# Patient Record
Sex: Female | Born: 1935 | Race: White | Hispanic: No | Marital: Married | Smoking: Never smoker
Health system: Southern US, Community
[De-identification: ages and names within clinical notes are randomized; demographics above are authoritative.]

## PROBLEM LIST (undated history)

## (undated) DIAGNOSIS — G039 Meningitis, unspecified: Secondary | ICD-10-CM

## (undated) DIAGNOSIS — E538 Deficiency of other specified B group vitamins: Secondary | ICD-10-CM

## (undated) DIAGNOSIS — E039 Hypothyroidism, unspecified: Secondary | ICD-10-CM

## (undated) DIAGNOSIS — H409 Unspecified glaucoma: Secondary | ICD-10-CM

## (undated) DIAGNOSIS — H269 Unspecified cataract: Secondary | ICD-10-CM

## (undated) DIAGNOSIS — E0500 Thyrotoxicosis with diffuse goiter without thyrotoxic crisis or storm: Secondary | ICD-10-CM

## (undated) DIAGNOSIS — E559 Vitamin D deficiency, unspecified: Secondary | ICD-10-CM

## (undated) DIAGNOSIS — B029 Zoster without complications: Secondary | ICD-10-CM

## (undated) HISTORY — PX: SPINAL CORD DECOMPRESSION: SHX97

## (undated) HISTORY — PX: THYROID SURGERY: SHX805

## (undated) HISTORY — DX: Hypothyroidism, unspecified: E03.9

## (undated) HISTORY — DX: Thyrotoxicosis with diffuse goiter without thyrotoxic crisis or storm: E05.00

## (undated) HISTORY — DX: Unspecified cataract: H26.9

## (undated) HISTORY — DX: Vitamin D deficiency, unspecified: E55.9

## (undated) HISTORY — PX: CATARACT EXTRACTION, BILATERAL: SHX1313

## (undated) HISTORY — DX: Meningitis, unspecified: G03.9

## (undated) HISTORY — DX: Unspecified glaucoma: H40.9

## (undated) HISTORY — DX: Deficiency of other specified B group vitamins: E53.8

## (undated) HISTORY — DX: Zoster without complications: B02.9

---

## 2015-07-07 DIAGNOSIS — Z8639 Personal history of other endocrine, nutritional and metabolic disease: Secondary | ICD-10-CM | POA: Insufficient documentation

## 2015-07-07 DIAGNOSIS — H409 Unspecified glaucoma: Secondary | ICD-10-CM | POA: Insufficient documentation

## 2016-03-03 DIAGNOSIS — Z9889 Other specified postprocedural states: Secondary | ICD-10-CM | POA: Insufficient documentation

## 2016-03-04 DIAGNOSIS — E559 Vitamin D deficiency, unspecified: Secondary | ICD-10-CM | POA: Insufficient documentation

## 2016-03-04 DIAGNOSIS — E538 Deficiency of other specified B group vitamins: Secondary | ICD-10-CM | POA: Insufficient documentation

## 2018-04-12 LAB — LIPID PANEL
Cholesterol: 216 — AB (ref 0–200)
HDL: 57 (ref 35–70)
LDL Cholesterol: 128
Triglycerides: 153 (ref 40–160)

## 2018-04-12 LAB — TSH: TSH: 1.55 (ref 0.41–5.90)

## 2018-04-12 LAB — VITAMIN B12: Vitamin B-12: 231

## 2018-04-12 LAB — VITAMIN D 25 HYDROXY (VIT D DEFICIENCY, FRACTURES): Vit D, 25-Hydroxy: 24.9

## 2019-01-21 LAB — CBC AND DIFFERENTIAL
HCT: 37 (ref 36–46)
Hemoglobin: 13.5 (ref 12.0–16.0)
Neutrophils Absolute: 10
Platelets: 333 (ref 150–399)
WBC: 13

## 2019-01-21 LAB — COMPREHENSIVE METABOLIC PANEL
Albumin: 3.8 (ref 3.5–5.0)
Calcium: 9.7 (ref 8.7–10.7)
GFR calc Af Amer: 43
GFR calc non Af Amer: 36
Globulin: 3.5

## 2019-01-21 LAB — BASIC METABOLIC PANEL
BUN: 26 — AB (ref 4–21)
CO2: 25 — AB (ref 13–22)
Chloride: 95 — AB (ref 99–108)
Creatinine: 1.4 — AB (ref 0.5–1.1)
Glucose: 112
Potassium: 3.8 (ref 3.4–5.3)
Sodium: 134 — AB (ref 137–147)

## 2019-01-21 LAB — HEPATIC FUNCTION PANEL
ALT: 15 (ref 7–35)
AST: 20 (ref 13–35)
Alkaline Phosphatase: 57 (ref 25–125)

## 2019-01-21 LAB — CBC: RBC: 4.54 (ref 3.87–5.11)

## 2019-10-07 ENCOUNTER — Encounter: Payer: Self-pay | Admitting: Physician Assistant

## 2019-10-07 ENCOUNTER — Other Ambulatory Visit: Payer: Self-pay | Admitting: Physician Assistant

## 2019-10-07 MED ORDER — LEVOTHYROXINE SODIUM 88 MCG PO TABS: 88.0000 ug | ORAL_TABLET | Freq: Every day | ORAL | 3 refills | Status: DC

## 2019-10-07 MED ORDER — BETIMOL 0.5 % OP SOLN: 1.0000 [drp] | Freq: Two times a day (BID) | OPHTHALMIC | 12 refills | Status: AC

## 2019-10-29 ENCOUNTER — Other Ambulatory Visit: Payer: Self-pay

## 2019-10-30 ENCOUNTER — Ambulatory Visit (INDEPENDENT_AMBULATORY_CARE_PROVIDER_SITE_OTHER): Payer: Medicare PPO | Admitting: Physician Assistant

## 2019-10-30 ENCOUNTER — Encounter: Payer: Self-pay | Admitting: Physician Assistant

## 2019-10-30 ENCOUNTER — Other Ambulatory Visit: Payer: Self-pay | Admitting: Physician Assistant

## 2019-10-30 ENCOUNTER — Ambulatory Visit (INDEPENDENT_AMBULATORY_CARE_PROVIDER_SITE_OTHER): Payer: Self-pay

## 2019-10-30 VITALS — BP 100/62 | HR 63 | Temp 97.3°F | Ht 67.0 in | Wt 133.2 lb

## 2019-10-30 DIAGNOSIS — M545 Low back pain, unspecified: Secondary | ICD-10-CM

## 2019-10-30 DIAGNOSIS — E039 Hypothyroidism, unspecified: Secondary | ICD-10-CM | POA: Diagnosis not present

## 2019-10-30 DIAGNOSIS — E559 Vitamin D deficiency, unspecified: Secondary | ICD-10-CM | POA: Diagnosis not present

## 2019-10-30 DIAGNOSIS — E538 Deficiency of other specified B group vitamins: Secondary | ICD-10-CM | POA: Diagnosis not present

## 2019-10-30 LAB — BASIC METABOLIC PANEL
BUN: 14 mg/dL (ref 6–23)
CO2: 26 mEq/L (ref 19–32)
Calcium: 10 mg/dL (ref 8.4–10.5)
Chloride: 105 mEq/L (ref 96–112)
Creatinine, Ser: 0.71 mg/dL (ref 0.40–1.20)
GFR: 78.08 mL/min (ref >60.00)
Glucose, Bld: 98 mg/dL (ref 70–99)
Potassium: 4.4 mEq/L (ref 3.5–5.1)
Sodium: 142 mEq/L (ref 135–145)

## 2019-10-30 LAB — VITAMIN D 25 HYDROXY (VIT D DEFICIENCY, FRACTURES): VITD: 31.28 ng/mL (ref 30.00–100.00)

## 2019-10-30 LAB — VITAMIN B12: Vitamin B-12: 133 pg/mL — ABNORMAL LOW (ref 211–911)

## 2019-10-30 LAB — TSH: TSH: 0.3 u[IU]/mL — ABNORMAL LOW (ref 0.35–4.50)

## 2019-10-30 NOTE — Progress Notes (Signed)
Teresa Hahn is a 86 y.o. female here to Establish care.  I acted as a Education administrator for [employer], PA-C Anselmo Pickler, LPN  History of Present Illness:   Chief Complaint  Patient presents with  . Back Pain   Back pain Pt c/o back pain x 1 week, lower back area and to the right. Pt was lifting her dog into bed. Heard a crack. Has been using heating pad and is taking 2 Excedrin at bedtime. L3-5 surgery in the past. Pain persists since that time. She denies new or unusual bowel/bladder incontinence, numbness/tingling, saddle anesthesia.  Hypothyroidism Has been on medication 15 years. Currently on 92mcg levothyroxine. Last TSH was checked in 2019 and was 1.55. Denies: fatigue, changes in weight, appetite changes.  Vit D and Vit B12 Deficiency History of this and would like labs checked today.  Health Maintenance: Immunizations -- Pt declines all vaccines Colonoscopy -- Never Mammogram -- Never PAP -- N/A Bone Density -- Never Weight -- Weight: 133 lb 4 oz (60.4 kg)   Depression screen [facility] 2/9 10/30/2019  Decreased Interest 0  Down, Depressed, Hopeless 0  PHQ - 2 Score 0    No flowsheet data found.   Other providers/specialists: Patient Care Team: Inda Coke, [place] as PCP - General (Physician Assistant)   Past Medical History:  Diagnosis Date  . Cataract   . Glaucoma   . Graves disease   . Hypothyroidism   . Meningitis    Viral  . Shingles    has had 4 times  . Vitamin B12 deficiency   . Vitamin D deficiency      Social History   Socioeconomic History  . Marital status: Married    Spouse name: Not on file  . Number of children: Not on file  . Years of education: Not on file  . Highest education level: Not on file  Occupational History  . Not on file  Tobacco Use  . Smoking status: Never Smoker  . Smokeless tobacco: Never Used  Substance and Sexual Activity  . Alcohol use: Never  . Drug use: Never  . Sexual activity: Not Currently  Other Topics  Concern  . Not on file  Social History Narrative   Married   Social Determinants of Health   Financial Resource Strain:   . Difficulty of Paying Living Expenses: Not on file  Food Insecurity:   . Worried About Charity fundraiser in the Last Year: Not on file  . Ran Out of Food in the Last Year: Not on file  Transportation Needs:   . Lack of Transportation (Medical): Not on file  . Lack of Transportation (Non-Medical): Not on file  Physical Activity:   . Days of Exercise per Week: Not on file  . Minutes of Exercise per Session: Not on file  Stress:   . Feeling of Stress : Not on file  Social Connections:   . Frequency of Communication with Friends and Family: Not on file  . Frequency of Social Gatherings with Friends and Family: Not on file  . Attends Religious Services: Not on file  . Active Member of Clubs or Organizations: Not on file  . Attends Archivist Meetings: Not on file  . Marital Status: Not on file  Intimate Partner Violence:   . Fear of Current or Ex-Partner: Not on file  . Emotionally Abused: Not on file  . Physically Abused: Not on file  . Sexually Abused: Not on file    Past  Surgical History:  Procedure Laterality Date  . CATARACT EXTRACTION, BILATERAL    . SPINAL CORD DECOMPRESSION     L3-5  . THYROID SURGERY      Family History  Problem Relation Age of Onset  . Alcohol abuse Mother   . Alcohol abuse Father     Allergies  Allergen Reactions  . Cyclobenzaprine Other (See Comments)    Mental lethargy     Current Medications:   Current Outpatient Medications:  .  levothyroxine (SYNTHROID) 88 MCG tablet, Take 1 tablet (88 mcg total) by mouth daily., Disp: 90 tablet, Rfl: 3 .  timolol (BETIMOL) 0.5 % ophthalmic solution, Place 1 drop into both eyes 2 (two) times daily., Disp: 10 mL, Rfl: 12   Review of Systems:   ROS  Negative unless otherwise specified per HPI.  Vitals:   Vitals:   10/30/19 0827  BP: 100/62  Pulse: 63   Temp: (!) 97.3 F (36.3 C)  TempSrc: Temporal  SpO2: 96%  Weight: 133 lb 4 oz (60.4 kg)  Height: 5\' 7"  (1.702 m)      Body mass index is 20.87 kg/m.  Physical Exam:   Physical Exam Vitals and nursing note reviewed.  Constitutional:      General: She is not in acute distress.    Appearance: She is well-developed. She is not ill-appearing or toxic-appearing.  Cardiovascular:     Rate and Rhythm: Normal rate and regular rhythm.     Pulses: Normal pulses.     Heart sounds: Normal heart sounds, S1 normal and S2 normal.     Comments: No LE edema Pulmonary:     Effort: Pulmonary effort is normal.     Breath sounds: Normal breath sounds.  Musculoskeletal:     Comments: Severe TTP to R paraspinal muscle; positive straight leg raise on R. Limited exam due to pain.   Skin:    General: Skin is warm and dry.  Neurological:     Mental Status: She is alert.     GCS: GCS eye subscore is 4. GCS verbal subscore is 5. GCS motor subscore is 6.  Psychiatric:        Speech: Speech normal.        Behavior: Behavior normal. Behavior is cooperative.        Assessment and Plan:   Teresa Hahn was seen today for back pain.  Diagnoses and all orders for this visit:  Hypothyroidism, unspecified type Update TSH and refill levothyroxine as appropriate. -     TSH  Lumbar pain Xray shows age indeterminate compression fracture at T12. Will have her start oral vitamin D and calcium. Take tylenol and use lidocaine patch. Follow-up with sports medicine if she is willing, worsening precautions advised. -     DG Lumbar Spine Complete; Future -     DG Lumbar Spine Complete -     Basic metabolic panel  Vitamin D deficiency, unspecified -     VITAMIN D 25 Hydroxy (Vit-D Deficiency, Fractures)  B12 deficiency -     Vitamin B12  Reviewed expectations re: course of current medical issues. Discussed self-management of symptoms. Outlined signs and symptoms indicating need for more acute  intervention. Patient verbalized understanding and all questions were answered. See orders for this visit as documented in the electronic medical record. Patient received an After-Visit Summary.  CMA or LPN served as scribe during this visit. History, Physical, and Plan performed by medical provider. The above documentation has been reviewed and is accurate and  complete.  This appointment required 60 minutes of patient care (this includes precharting, chart review, review of results, face-to-face care, etc.).  Inda Coke, PA-C

## 2019-10-30 NOTE — Patient Instructions (Signed)
It was great to see you!  Let's try the lidocaine patch for your back -- these can be found at the store. Take tylenol during the day to help with your pain. Avoid too much ibuprofen because it can cause worsening kidney function.  I will be in touch as soon as the radiologist has read the xray.  Take care,  Inda Coke PA-C

## 2019-10-31 ENCOUNTER — Other Ambulatory Visit: Payer: Self-pay | Admitting: Physician Assistant

## 2019-10-31 MED ORDER — LEVOTHYROXINE SODIUM 50 MCG PO TABS: 50.0000 ug | ORAL_TABLET | Freq: Every day | ORAL | 1 refills | Status: AC

## 2019-11-04 ENCOUNTER — Other Ambulatory Visit: Payer: Self-pay

## 2019-11-04 ENCOUNTER — Encounter: Payer: Self-pay | Admitting: Family Medicine

## 2019-11-04 ENCOUNTER — Ambulatory Visit: Payer: Medicare PPO | Admitting: Family Medicine

## 2019-11-04 VITALS — BP 140/90 | HR 65 | Ht 67.0 in | Wt 132.2 lb

## 2019-11-04 DIAGNOSIS — S22080A Wedge compression fracture of T11-T12 vertebra, initial encounter for closed fracture: Secondary | ICD-10-CM | POA: Diagnosis not present

## 2019-11-04 DIAGNOSIS — Z780 Asymptomatic menopausal state: Secondary | ICD-10-CM

## 2019-11-04 NOTE — Progress Notes (Signed)
Subjective:    I'm seeing this patient as a consultation for:  Teresa Hahn. Note will be routed back to referring provider/PCP.  CC: Mid-low back pain  I, Molly Weber, LAT, ATC, am serving as scribe for Dr. Lynne Leader.  HPI: Pt is a 86 y/o female presenting w/ c/o mid-low back pain x ~ 2 weeks after hearing a crack in her back while lifting her dog into bed.  She has a hx of L3-5 spinal surgery and has a T12 compression fracture.  She rates her pain at a 7/10 that she describes mainly as aching/nagging but can be sharp at times.  Radiating pain: No Numbness/tingling: No Aggravating factors: prolonged sitting,  spinal flexion, active R hip flexion; bladder urgency changes but not completely new Improves with: sidelying w/ pillow between knees Treatments tried: Heat and Excedrin; Lidocaine patch; Vit D and Calcium Diagnostic testing: L-spine XR - 10/30/19  Past medical history, Surgical history, Family history, Social history, Allergies, and medications have been entered into the medical record, reviewed.   Review of Systems: No new headache, visual changes, nausea, vomiting, diarrhea, constipation, dizziness, abdominal pain, skin rash, fevers, chills, night sweats, weight loss, swollen lymph nodes, body aches, joint swelling, muscle aches, chest pain, shortness of breath, mood changes, visual or auditory hallucinations.   Objective:    Vitals:   11/04/19 1041  BP: 140/90  Pulse: 65  SpO2: 95%   General: Well Developed, well nourished, and in no acute distress.  Neuro/Psych: Alert and oriented x3, extra-ocular muscles intact, able to move all 4 extremities, sensation grossly intact. Skin: Warm and dry, no rashes noted.  Respiratory: Not using accessory muscles, speaking in full sentences, trachea midline.  Cardiovascular: Pulses palpable, no extremity edema. Abdomen: Does not appear distended. MSK:  Tender palpation midline and paraspinal musculature lower thoracic high lumbar  region. Decreased lumbar and thoracic motion due to pain. Lower extremity strength reflexes and sensation are equal normal throughout bilateral extremities.  Lab and Radiology Results DG Lumbar Spine Complete  Result Date: 10/30/2019 CLINICAL DATA:  Status post recent back injury. EXAM: LUMBAR SPINE - COMPLETE 4+ VIEW COMPARISON:  None. FINDINGS: Osteopenia and spinal degenerative changes. Degenerative changes greatest at L1-L2. Disc space narrowing also noted at L2-3 and L3-4. Age-indeterminate compression fracture at T12. Mild dextroconvexity at L2-3. Abundant stool incidentally noted in the colon. IMPRESSION: 1. Age indeterminate compression fracture at T12. No priors available for comparison. 2. 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 Electronically Signed   By: Zetta Bills M.D.   On: 10/30/2019 09:40   I, Lynne Leader, personally (independently) visualized and performed the interpretation of the images attached in this note.   Impression and Recommendations:    Assessment and Plan: 86 y.o. female with mid back pain due to compression fracture at T12.  Patient also has a component of paraspinal muscular dysfunction as well secondary to the fracture.  Discussed options.  Patient would like to avoid vertebroplasty kyphoplasty if possible.  We will continue over-the-counter medications for pain control.  However will use semirigid lumbosacral support as she would not tolerate true TLSO brace.  Additionally will proceed with CT scan of thoracic spine to further characterize fracture and for kyphoplasty/vertebroplasty planning if needed in the near future.  Additionally will arrange for DEXA scan to evaluate for bone density.  Recommend going ahead and starting high-dose vitamin D supplementation.  Will do prescription equivalent of 5000 units D3 daily.  Recheck back with me  in about 2 weeks.  Return sooner if needed.  Additionally provided information regarding  COVID-19 vaccine.   PDMP not reviewed this encounter. Orders Placed This Encounter  Procedures  . DG Bone Density    Standing Status:   Future    Standing Expiration Date:   01/01/2021    Order Specific Question:   Reason for Exam (SYMPTOM  OR DIAGNOSIS REQUIRED)    Answer:   eval bone density following compression fracture tspine    Order Specific Question:   Preferred imaging location?    Answer:   [facility]  . CT THORACIC SPINE WO CONTRAST    Standing Status:   Future    Standing Expiration Date:   02/01/2021    Order Specific Question:   ** REASON FOR EXAM (FREE TEXT)    Answer:   t12 compression fracture. Plan for vertebroplasty/kyphoplasty    Order Specific Question:   Preferred imaging location?    Answer:   [facility]    Order Specific Question:   Radiology Contrast Protocol - do NOT remove file path    Answer:   [email or web address]   No orders of the defined types were placed in this encounter.   Discussed warning signs or symptoms. Please see discharge instructions. Patient expresses understanding.   The above documentation has been reviewed and is accurate and complete Lynne Leader

## 2019-11-04 NOTE — Patient Instructions (Signed)
Thank you for coming in today. We will try a back brace.  Use as needed for pain.  You should hear about the CT scan and bone density test soon.  Recheck with me in 2 weeks.   Take 5000 units of vit D over the counter daily.  Calcium about '1000mg'$  twice daily.    Spinal Compression Fracture  A spinal compression fracture is a collapse of the bones that form the spine (vertebrae). With this type of fracture, the vertebrae become pushed (compressed) into a wedge shape. Most compression fractures happen in the middle or lower part of the spine. What are the causes? This condition may be caused by:  Thinning and loss of density in the bones (osteoporosis). This is the most common cause.  A fall.  A car or motorcycle accident.  Cancer.  Trauma, such as a heavy, direct hit to the head or back. What increases the risk? You are more likely to develop this condition if:  You are 14 years or older.  You have osteoporosis.  You have certain types of cancer, including: ? Multiple myeloma. ? Lymphoma. ? Prostate cancer. ? Lung cancer. ? Breast cancer. What are the signs or symptoms? Symptoms of this condition include:  Severe pain.  Pain that gets worse over time.  Pain that is worse when you stand, walk, sit, or bend.  Sudden pain that is so bad that it is hard for you to move.  Bending or humping of the spine.  Gradual loss of height.  Numbness, tingling, or weakness in the back and legs.  Trouble walking. Your symptoms will depend on the cause of the fracture and how quickly it develops. How is this diagnosed? This condition may be diagnosed based on symptoms, medical history, and a physical exam. During the physical exam, your health care provider may tap along the length of your spine to check for tenderness. Tests may be done to confirm the diagnosis. They may include:  A bone mineral density test to check for osteoporosis.  Imaging tests, such as a spine X-ray,  CT scan, or MRI. How is this treated? Treatment for this condition depends on the cause and severity of the condition. Some fractures may heal on their own with supportive care. Treatment may include:  Pain medicine.  Rest.  A back brace.  Physical therapy exercises.  Medicine to strengthen bone.  Calcium and vitamin D supplements. Fractures that cause the back to become misshapen, cause nerve pain or weakness, or do not respond to other treatment may be treated with surgery. This may include:  Vertebroplasty. Bone cement is injected into the collapsed vertebrae to stabilize them.  Balloon kyphoplasty. The collapsed vertebrae are expanded with a balloon and then bone cement is injected into them.  Spinal fusion. The collapsed vertebrae are connected (fused) to normal vertebrae. Follow these instructions at home: Medicines  Take over-the-counter and prescription medicines only as told by your health care provider.  Do not drive or operate heavy machinery while taking prescription pain medicine.  If you are taking prescription pain medicine, take actions to prevent or treat constipation. Your health care provider may recommend that you: ? Drink enough fluid to keep your urine pale yellow. ? Eat foods that are high in fiber, such as fresh fruits and vegetables, whole grains, and beans. ? Limit foods that are high in fat and processed sugars, such as fried or sweet foods. ? Take an over-the-counter or prescription medicine for constipation. If you have  a brace:  Wear the brace as told by your health care provider. Remove it only as told by your health care provider.  Loosen the brace if your fingers or toes tingle, become numb, or turn cold and blue.  Keep the brace clean.  If the brace is not waterproof: ? Do not let it get wet. ? Cover it with a watertight covering when you take a bath or a shower. Managing pain, stiffness, and swelling   If directed, apply ice to the  injured area: ? If you have a removable brace, remove it as told by your health care provider. ? Put ice in a plastic bag. ? Place a towel between your skin and the bag. ? Leave the ice on for 30 minutes every two hours at first. Then apply the ice as needed. Activity  Rest as told by your health care provider. ? Avoid sitting for a long time without moving. Get up to take short walks every 1-2 hours. This is important to improve blood flow and breathing. Ask for help if you feel weak or unsteady.  Return to your normal activities as directed by your health care provider. Ask what activities are safe for you.  Do exercises to improve motion and strength in your back (physical therapy), as recommended by your health care provider.  Exercise regularly as directed by your health care provider. General instructions   Do not drink alcohol. Alcohol can interfere with your treatment.  Do not use any products that contain nicotine or tobacco, such as cigarettes and e-cigarettes. These can delay bone healing. If you need help quitting, ask your health care provider.  Keep all follow-up visits as told by your health care provider. This is important. It can help to prevent permanent injury, disability, and long-lasting (chronic) pain. Contact a health care provider if:  You have a fever.  You develop a cough that makes your pain worse.  Your pain medicine is not helping.  Your pain does not get better over time.  You cannot return to your normal activities as planned or expected. Get help right away if:  Your pain is very bad and it suddenly gets worse.  You are unable to move any body part (paralysis) that is below the level of your injury.  You have numbness, tingling, or weakness in any body part that is below the level of your injury.  You cannot control your bladder or bowels. Summary  A spinal compression fracture is a collapse of the bones that form the spine (vertebrae).   With this type of fracture, the vertebrae become pushed (compressed) into a wedge shape.  Your symptoms and treatment will depend on the cause and severity of the fracture and how quickly it develops.  Some fractures may heal on their own with supportive care. Fractures that cause the back to become misshapen, cause nerve pain or weakness, or do not respond to other treatment may be treated with surgery. This information is not intended to replace advice given to you by your health care provider. Make sure you discuss any questions you have with your health care provider. Document Revised: 11/29/2018 Document Reviewed: 11/14/2017 Elsevier Patient Education  2020 [employer].

## 2019-11-08 ENCOUNTER — Other Ambulatory Visit: Payer: Medicare PPO

## 2019-11-13 ENCOUNTER — Telehealth: Payer: Self-pay | Admitting: Family Medicine

## 2019-11-13 NOTE — Telephone Encounter (Signed)
Patient's daughter called stating that the patient has been scheduled for the CT on 11/27/2019.  They currently have an appointment with Dr Georgina Snell on Monday, 11/18/2019. She wanted to know if they need to wait and move that appointment out until after the CT or if she still needs to be seen? She currently no better and has started to experience numbness in her hips, per the patient's daughter.

## 2019-11-14 NOTE — Telephone Encounter (Signed)
Called and LM on daughter Melissa's VM (ok per DPR) for pt to come to her appt this Monday, Nov 18, 2019.

## 2019-11-14 NOTE — Telephone Encounter (Signed)
Based on worsening symptoms patient should be seen Monday.  If needed CT scan can be scheduled for earlier than February 10.

## 2019-11-18 ENCOUNTER — Ambulatory Visit: Payer: Medicare PPO | Admitting: Family Medicine

## 2019-11-18 ENCOUNTER — Encounter: Payer: Self-pay | Admitting: Family Medicine

## 2019-11-18 ENCOUNTER — Telehealth: Payer: Self-pay | Admitting: Family Medicine

## 2019-11-18 ENCOUNTER — Ambulatory Visit (INDEPENDENT_AMBULATORY_CARE_PROVIDER_SITE_OTHER): Payer: Medicare PPO

## 2019-11-18 ENCOUNTER — Other Ambulatory Visit: Payer: Self-pay

## 2019-11-18 VITALS — BP 122/78 | HR 65 | Ht 67.0 in | Wt 132.4 lb

## 2019-11-18 DIAGNOSIS — S22080A Wedge compression fracture of T11-T12 vertebra, initial encounter for closed fracture: Secondary | ICD-10-CM

## 2019-11-18 NOTE — Progress Notes (Signed)
T12 compression fracture seen on CT scan.  Proceed with kyphoplasty or vertebroplasty for this.  You should hear soon about scheduling.  Numbness is worsening let me know we will proceed with MRI.

## 2019-11-18 NOTE — Telephone Encounter (Signed)
Based on CT scan results we will proceed with kyphoplasty or vertebroplasty.  Placed referral to interventional radiology at [facility].  You should hear soon about scheduling.

## 2019-11-18 NOTE — Patient Instructions (Signed)
Thank you for coming in today. You should hear from [place] about CT scan.  [phone] Let me know if you do not hear by 12 noon.  Expect Ct scan to be done today.

## 2019-11-18 NOTE — Progress Notes (Signed)
I, Wendy Poet, LAT, ATC, am serving as scribe for Dr. Lynne Leader.  Teresa Hahn is a 86 y.o. female who presents to [place] [facility] today for f/u of mid-low back pain.  She was last seen by Dr. Georgina Snell on 11/04/19 and was provided w/ a lumbosacral support brace.  A CT of her t-spine was ordered and she will be having this exam on 11/27/19.  At her last visit, she rated her pain at a 7/10 and described her pain as aching and nagging.  Her pain is worse w/ sitting or any type of spinal flexion activity.  She has tried heat, Excedrin, lidocaine patches and takes Vit D and calcium.  She also has a hx of a T12 compression fx and lumbar spinal surgery.  Since her last visit, the pt reports that she feels about the same w/ the exception of decreased sensation in her B hips (R>L) and her B great toes.  Pt reports that she con't to wear her lumbosacral brace and uses a heating pad which both help her symptoms.   Pertinent review of systems: No fevers or chills no new urinary symptoms no new weakness.  Relevant historical information: Postmenopausal.   Exam:  BP 122/78 (BP Location: Left Arm, Patient Position: Sitting, Cuff Size: Normal)   Pulse 65   Ht 5\' 7"  (1.702 m)   Wt 132 lb 6.4 oz (60.1 kg)   SpO2 96%   BMI 20.74 kg/m  General: Well Developed, well nourished, and in no acute distress.   MSK: T-spine tender to palpation mildly midline around T12 otherwise nontender. L-spine: Mature.  Scar posterior midline nontender. Decreased thoracic and lumbar motion. Lower extremity strength is intact throughout bilateral extremities.  Reflexes equal bilateral lower extremities. Mild subjective numbness lateral iliac crest versus the very lower portion of the trunk laterally bilaterally. Additionally mild subjective numbness medial great toes bilaterally. Normal gait.    Lab and Radiology Results DG Lumbar Spine Complete  Result Date: 10/30/2019 CLINICAL DATA:  Status  post recent back injury. EXAM: LUMBAR SPINE - COMPLETE 4+ VIEW COMPARISON:  None. FINDINGS: Osteopenia and spinal degenerative changes. Degenerative changes greatest at L1-L2. Disc space narrowing also noted at L2-3 and L3-4. Age-indeterminate compression fracture at T12. Mild dextroconvexity at L2-3. Abundant stool incidentally noted in the colon. IMPRESSION: 1. Age indeterminate compression fracture at T12. No priors available for comparison. 2. 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 Electronically Signed   By: Zetta Bills M.D.   On: 10/30/2019 09:40   I, Lynne Leader, personally (independently) visualized and performed the interpretation of the images attached in this note.     Assessment and Plan: 86 y.o. female with continued thoracic back pain due to T12 compression fracture.  CT scan T-spine was ordered at the last visit and is still pending scheduled for February 10.  This seems a bit odd to me that it was scheduled so late.  I have called my colleagues at [facility] imaging and should be able to arrange for CT scan T-spine today.  This is currently in process as the note is being written. New numbness today is obviously a bit concerning however she does not have severe progressive numbness nor weakness.  I believe CT scan should be first priority and if needed could proceed with MRI.  Anticipate proceeding with vertebroplasty/kyphoplasty following CT scan.   PDMP not reviewed this encounter. No orders of the defined types were placed  in this encounter.  No orders of the defined types were placed in this encounter.    Discussed warning signs or symptoms. Please see discharge instructions. Patient expresses understanding.   The above documentation has been reviewed and is accurate and complete Lynne Leader

## 2019-11-18 NOTE — Telephone Encounter (Signed)
Called and LM on daughter Melissa's VM per DPR w/ T-spine CT results and provided info regarding referral for kyphoplasty/vertebroplasty.

## 2019-11-20 ENCOUNTER — Other Ambulatory Visit: Payer: Medicare PPO

## 2019-11-21 ENCOUNTER — Telehealth (HOSPITAL_COMMUNITY): Payer: Self-pay

## 2019-11-21 NOTE — Telephone Encounter (Signed)
-----   Message from Ronney Lion, [place] sent at 11/20/2019 11:07 AM EST ----- Lia Foyer,  I had Dr. Estanislado Pandy review. He has approved patient for an image-guided T12 KP/VP, first available. No need to consult prior unless requested by patient. Please schedule and please send bone bx request form to referring. Please let me know if you have any questions.  Thanks! Ally ----- Message ----- From: Danielle Dess Sent: 11/20/2019  10:41 AM EST To: Edmon Crape,   New referral for kyphoplasty.Please review.   Thanks,  Lia Foyer

## 2019-11-21 NOTE — Telephone Encounter (Signed)
Received a referral from Dr. Georgina Snell for kyphoplasty. Called Teresa Hahn and she would like to proceed. I've sent a request to insurance. As soon as a get auth I will call Teresa Hahn to schedule. Teresa Hahn agreed with this plan. Informed me to call her daughter to schedule once insurance authorization comes back. AW

## 2019-11-26 NOTE — Telephone Encounter (Signed)
Patient's daughter called very upset because nothing has been done for her mother. She said that she feels like someone has dropped the ball on this. She said that no one has called her about the Kyphoplasty/vertebroplast and she is not doing any better. She is very concerned and wants to make sure that this is taken of.  She would like a call back today to discuss.  Lenna Sciara (patient's daughter): [phone]

## 2019-11-26 NOTE — Telephone Encounter (Signed)
Called and LM on daughter's VM w/ information regarding kyphoplasty/vertebroplasty.  Provided Melissa w/ the office number for Dr. Estanislado Pandy at [facility] so that she can call and schedule an appt for her mother.  That number is [phone].

## 2019-11-27 ENCOUNTER — Ambulatory Visit: Payer: Medicare PPO

## 2019-11-27 NOTE — Telephone Encounter (Signed)
Patient's daughter called back asking if there is someone that she can see in [place]. She said that they have tried to call Dr Arlean Hopping office but they can not get anyone to answer the phone so she would like to go somewhere else.  Please advise.

## 2019-11-27 NOTE — Telephone Encounter (Signed)
I did some research and Dr. Rudene Christians at [facility] in [place] does the Kyphoplasty procedure.  I have sent a new referral.   [facility] Neurosurgery Department [place], [place]  [address]  [phone]  You should hear from his office soon. They think they should be able to get you in next week.

## 2019-11-27 NOTE — Telephone Encounter (Signed)
Called Melissa and LM.  Explained that Dr. Arlean Hopping office did reach out to either [place] or [place] on 11/21/19 and explained that Dr. Estanislado Pandy is willing to see Ravae but that they were waiting on insurance authorization.  They will call to schedule once they have insurance authorization.  Also explained that Dr. Georgina Snell has found another provider Dr. Rudene Christians in [place] at the [facility] who does the same procedure.  However, Dr. Rudene Christians will need to review pt's case and agree to see pt and then they will still need the insurance authorization so pt and daughter need to be patient to let the process work.  Also advised pt that she could call her insurance company and ask them some questions as to what is holding up the authorization.

## 2019-11-28 ENCOUNTER — Telehealth (HOSPITAL_COMMUNITY): Payer: Self-pay

## 2019-11-28 ENCOUNTER — Other Ambulatory Visit (HOSPITAL_COMMUNITY): Payer: Self-pay | Admitting: Interventional Radiology

## 2019-11-28 DIAGNOSIS — S22080A Wedge compression fracture of T11-T12 vertebra, initial encounter for closed fracture: Secondary | ICD-10-CM

## 2019-11-28 NOTE — Telephone Encounter (Signed)
Called to schedule kyphoplasty, no answer, left vm. AW  

## 2019-12-02 ENCOUNTER — Emergency Department: Payer: Medicare PPO

## 2019-12-02 ENCOUNTER — Other Ambulatory Visit: Payer: Self-pay

## 2019-12-02 ENCOUNTER — Inpatient Hospital Stay
Admission: EM | Admit: 2019-12-02 | Discharge: 2019-12-05 | DRG: 389 | Disposition: A | Discharge status: Home / Self Care | Payer: Medicare PPO | Attending: Hospitalist | Admitting: Hospitalist

## 2019-12-02 ENCOUNTER — Encounter: Payer: Self-pay | Admitting: Emergency Medicine

## 2019-12-02 DIAGNOSIS — R202 Paresthesia of skin: Secondary | ICD-10-CM | POA: Diagnosis present

## 2019-12-02 DIAGNOSIS — E039 Hypothyroidism, unspecified: Secondary | ICD-10-CM | POA: Diagnosis present

## 2019-12-02 DIAGNOSIS — R32 Unspecified urinary incontinence: Secondary | ICD-10-CM | POA: Diagnosis present

## 2019-12-02 DIAGNOSIS — M4854XA Collapsed vertebra, not elsewhere classified, thoracic region, initial encounter for fracture: Secondary | ICD-10-CM | POA: Diagnosis present

## 2019-12-02 DIAGNOSIS — M545 Low back pain, unspecified: Secondary | ICD-10-CM | POA: Diagnosis present

## 2019-12-02 DIAGNOSIS — R296 Repeated falls: Secondary | ICD-10-CM | POA: Diagnosis present

## 2019-12-02 DIAGNOSIS — Z888 Allergy status to other drugs, medicaments and biological substances status: Secondary | ICD-10-CM

## 2019-12-02 DIAGNOSIS — K5900 Constipation, unspecified: Secondary | ICD-10-CM | POA: Diagnosis not present

## 2019-12-02 DIAGNOSIS — G8929 Other chronic pain: Secondary | ICD-10-CM

## 2019-12-02 DIAGNOSIS — Z66 Do not resuscitate: Secondary | ICD-10-CM | POA: Diagnosis present

## 2019-12-02 DIAGNOSIS — K5641 Fecal impaction: Secondary | ICD-10-CM | POA: Diagnosis not present

## 2019-12-02 DIAGNOSIS — Z20822 Contact with and (suspected) exposure to covid-19: Secondary | ICD-10-CM | POA: Diagnosis present

## 2019-12-02 DIAGNOSIS — H409 Unspecified glaucoma: Secondary | ICD-10-CM | POA: Diagnosis present

## 2019-12-02 DIAGNOSIS — E0500 Thyrotoxicosis with diffuse goiter without thyrotoxic crisis or storm: Secondary | ICD-10-CM | POA: Diagnosis present

## 2019-12-02 DIAGNOSIS — Z79890 Hormone replacement therapy (postmenopausal): Secondary | ICD-10-CM

## 2019-12-02 DIAGNOSIS — N200 Calculus of kidney: Secondary | ICD-10-CM | POA: Diagnosis present

## 2019-12-02 LAB — BASIC METABOLIC PANEL
Anion gap: 11 (ref 5–15)
BUN: 13 mg/dL (ref 8–23)
CO2: 22 mmol/L (ref 22–32)
Calcium: 9.3 mg/dL (ref 8.9–10.3)
Chloride: 105 mmol/L (ref 98–111)
Creatinine, Ser: 0.73 mg/dL (ref 0.44–1.00)
GFR calc Af Amer: >60 mL/min (ref >60)
GFR calc non Af Amer: >60 mL/min (ref >60)
Glucose, Bld: 111 mg/dL — ABNORMAL HIGH (ref 70–99)
Potassium: 4.3 mmol/L (ref 3.5–5.1)
Sodium: 138 mmol/L (ref 135–145)

## 2019-12-02 LAB — CBC
HCT: 45.7 % (ref 36.0–46.0)
Hemoglobin: 14.1 g/dL (ref 12.0–15.0)
MCH: 29.7 pg (ref 26.0–34.0)
MCHC: 30.9 g/dL (ref 30.0–36.0)
MCV: 96.4 fL (ref 80.0–100.0)
Platelets: 264 10*3/uL (ref 150–400)
RBC: 4.74 MIL/uL (ref 3.87–5.11)
RDW: 13.3 % (ref 11.5–15.5)
WBC: 10.9 10*3/uL — ABNORMAL HIGH (ref 4.0–10.5)
nRBC: 0 % (ref 0.0–0.2)

## 2019-12-02 LAB — MAGNESIUM: Magnesium: 2.4 mg/dL (ref 1.7–2.4)

## 2019-12-02 MED ORDER — SENNOSIDES-DOCUSATE SODIUM 8.6-50 MG PO TABS
1.0000 | ORAL_TABLET | Freq: Two times a day (BID) | ORAL | Status: DC
  Administered 2019-12-02 – 2019-12-03 (×2): 1 via ORAL
  Filled 2019-12-02 (×2): qty 1

## 2019-12-02 MED ORDER — SENNOSIDES-DOCUSATE SODIUM 8.6-50 MG PO TABS: 1.0000 | ORAL_TABLET | Freq: Two times a day (BID) | ORAL | Status: DC

## 2019-12-02 MED ORDER — POLYETHYLENE GLYCOL 3350 17 G PO PACK
17.0000 g | PACK | Freq: Two times a day (BID) | ORAL | Status: DC
  Administered 2019-12-02 – 2019-12-04 (×4): 17 g via ORAL
  Filled 2019-12-02 (×4): qty 1

## 2019-12-02 MED ORDER — METHOCARBAMOL 500 MG PO TABS: 500.0000 mg | ORAL_TABLET | Freq: Three times a day (TID) | ORAL | Status: DC | PRN

## 2019-12-02 MED ORDER — ONDANSETRON HCL 4 MG/2ML IJ SOLN
4.0000 mg | Freq: Three times a day (TID) | INTRAMUSCULAR | Status: DC | PRN
  Administered 2019-12-03: 4 mg via INTRAVENOUS
  Filled 2019-12-02: qty 2

## 2019-12-02 MED ORDER — ACETAMINOPHEN 650 MG RE SUPP: 650.0000 mg | Freq: Four times a day (QID) | RECTAL | Status: DC | PRN

## 2019-12-02 MED ORDER — OXYCODONE-ACETAMINOPHEN 5-325 MG PO TABS
1.0000 | ORAL_TABLET | ORAL | Status: DC | PRN
  Administered 2019-12-02: 22:00:00 1 via ORAL
  Filled 2019-12-02: qty 1

## 2019-12-02 MED ORDER — TIMOLOL MALEATE 0.5 % OP SOLN
1.0000 [drp] | Freq: Two times a day (BID) | OPHTHALMIC | Status: DC
  Administered 2019-12-02 – 2019-12-05 (×6): 1 [drp] via OPHTHALMIC
  Filled 2019-12-02: qty 5

## 2019-12-02 MED ORDER — POLYETHYLENE GLYCOL 3350 17 G PO PACK
17.0000 g | PACK | Freq: Every day | ORAL | Status: DC
  Filled 2019-12-02: qty 1

## 2019-12-02 MED ORDER — ENOXAPARIN SODIUM 40 MG/0.4ML ~~LOC~~ SOLN
40.0000 mg | SUBCUTANEOUS | Status: DC
  Administered 2019-12-03 – 2019-12-05 (×3): 40 mg via SUBCUTANEOUS
  Filled 2019-12-02 (×3): qty 0.4

## 2019-12-02 MED ORDER — MAGNESIUM CITRATE PO SOLN
1.0000 | Freq: Once | ORAL | Status: AC
  Administered 2019-12-02: 1 via ORAL
  Filled 2019-12-02: qty 296

## 2019-12-02 MED ORDER — POLYETHYLENE GLYCOL 3350 17 G PO PACK: 17.0000 g | PACK | Freq: Two times a day (BID) | ORAL | Status: DC

## 2019-12-02 MED ORDER — ONDANSETRON HCL 4 MG/2ML IJ SOLN
4.0000 mg | Freq: Once | INTRAMUSCULAR | Status: AC
  Administered 2019-12-02: 09:00:00 4 mg via INTRAVENOUS
  Filled 2019-12-02: qty 2

## 2019-12-02 MED ORDER — POLYETHYLENE GLYCOL 3350 17 GM/SCOOP PO POWD: 17.0000 g | Freq: Every day | ORAL | 0 refills | Status: DC | PRN

## 2019-12-02 MED ORDER — ACETAMINOPHEN 325 MG PO TABS
650.0000 mg | ORAL_TABLET | Freq: Four times a day (QID) | ORAL | Status: DC | PRN
  Administered 2019-12-04 (×2): 650 mg via ORAL
  Filled 2019-12-02 (×2): qty 2

## 2019-12-02 MED ORDER — CALCIUM CARBONATE-VITAMIN D 500-200 MG-UNIT PO TABS
1.0000 | ORAL_TABLET | Freq: Every day | ORAL | Status: DC
  Administered 2019-12-02 – 2019-12-05 (×4): 1 via ORAL
  Filled 2019-12-02 (×4): qty 1

## 2019-12-02 MED ORDER — SENNA 8.6 MG PO TABS
2.0000 | ORAL_TABLET | Freq: Once | ORAL | Status: AC
  Administered 2019-12-02: 08:00:00 17.2 mg via ORAL
  Filled 2019-12-02: qty 2

## 2019-12-02 MED ORDER — LEVOTHYROXINE SODIUM 50 MCG PO TABS
50.0000 ug | ORAL_TABLET | Freq: Every day | ORAL | Status: DC
  Administered 2019-12-03 – 2019-12-05 (×3): 50 ug via ORAL
  Filled 2019-12-02 (×3): qty 1

## 2019-12-02 NOTE — ED Provider Notes (Signed)
No bowel movement with enema.   Reviewed CT findings, there is an area of wall thickening in the mid sigmoid that could represent focal colitis including stercoral disease.  Discussed findings with surgery.  CT somewhat limiting due to lack of contrast, however as patient has no peritoneal signs on exam, okay to proceed with bowel regimen.  Discussed further bowel regimen here vs at home with patient, who would like to try something additional here, would like to have a BM prior to leaving. Will trial senna and magnesium citrate.   Patient vomited magnesium citrate. Will give Zofran. Still no bowel movement and reports continued discomfort. She does not feel comfortable going home w/ continued constipation, abdominal discomfort and now nausea. Will plan to admit for more aggressive bowel regimen.     Lilia Pro., MD 12/02/19 1039

## 2019-12-02 NOTE — ED Notes (Signed)
Larene Beach, RN attempted to call patient's daughter. Call went directly to voicemail. Family was not identified on voicemail so RN did not leave message.

## 2019-12-02 NOTE — ED Notes (Signed)
Pt assisted to bedside commode after having a small BM while asleep. Pt's linen changed and pt assisted into a new brief.

## 2019-12-02 NOTE — H&P (Signed)
History and Physical    Teresa Hahn [id number] DOB: [birth year] DOA: 12/02/2019  Referring MD/NP/PA:   PCP: Inda Coke, PA   Patient coming from:  The patient is coming from home.  At baseline, pt is independent for most of ADL.        Chief Complaint: Constipation and back pain  HPI: Teresa Hahn is a 86 y.o. female with medical history significant of hypothyroidism, Graves' disease, vitamin D deficiency, vitamin B12 deficiency, viral meningitis, glaucoma, chronic back pain, who presents with constipation and back pain.  Patient states that she has chronic lower back pain due to compression fracture of the lower back that was caused by  Fall. She is currently seeing a spine specialist and is undergoing consideration for kyphoplasty. She has urinary incontinence sometimes. She also feels tingling and decreased sensation in her big toes bilaterally sometimes. Patient states she had an MRI performed approximately 2 or 3 weeks ago for the same and is waiting to follow-up with her spine specialist.   She states that she has severely been constipated recently, and last bowel movement was more 2 weeks ago. She has mild lateral lower abdominal pain.  No nausea or vomiting at home.  She vomited once after taking laxatives (senna, MiraLAX and magnesium citrate) in ED.  Patient states she has tried over-the-counter laxatives suppositories without success so she came to the emergency department.  Patient does not have chest pain, shortness breath, cough, fever or chills.  No symptoms of UTI.  No facial droop or slurred speech   ED Course: pt was found to have pending Covid PCR, WBC 10.9, electrolytes renal function okay, temperature normal, blood pressure 177/91, heart rate 77, oxygen saturation 97% on room air. Pt is placed on MedSurg bed for observation.  CT-renal stone: 1. Large colonic stool burden throughout the colon consistent with constipation. Focal area of colonic wall thickening  in the mid sigmoid with pericolonic edema may represent focal colitis, including stercoral disease. 2. No hydronephrosis or obstructive uropathy. Urinary bladder is mildly trabeculated with bladder diverticulum on the right.  3. Incidental findings of cholelithiasis, right adrenal adenoma, and small hiatal hernia. 4. Aortic Atherosclerosis (ICD10-I70.0).  Review of Systems:   General: no fevers, chills, no body weight gain, has fatigue HEENT: no blurry vision, hearing changes or sore throat Respiratory: no dyspnea, coughing, wheezing CV: no chest pain, no palpitations GI: has nausea, vomiting, abdominal pain, constipation GU: no dysuria, burning on urination, increased urinary frequency, hematuria  Ext: no leg edema Neuro: no unilateral weakness, no vision change or hearing loss Skin: no rash, no skin tear. MSK: has lower back pain Heme: No easy bruising.  Travel history: No recent long distant travel.  Allergy:  Allergies  Allergen Reactions  . Cyclobenzaprine Other (See Comments)    Mental lethargy    Past Medical History:  Diagnosis Date  . Cataract   . Glaucoma   . Graves disease   . Hypothyroidism   . Meningitis    Viral  . Shingles    has had 4 times  . Vitamin B12 deficiency   . Vitamin D deficiency     Past Surgical History:  Procedure Laterality Date  . CATARACT EXTRACTION, BILATERAL    . SPINAL CORD DECOMPRESSION     L3-5  . THYROID SURGERY      Social History:  reports that she has never smoked. She has never used smokeless tobacco. She reports that she does not drink alcohol or use drugs.  Family History:  Family History  Problem Relation Age of Onset  . Alcohol abuse Mother   . Alcohol abuse Father      Prior to Admission medications   Medication Sig Start Date End Date Taking? Authorizing Provider  levothyroxine (SYNTHROID) 50 MCG tablet Take 1 tablet (50 mcg total) by mouth daily. 10/31/19   Inda Coke, PA  polyethylene glycol powder  (GLYCOLAX/MIRALAX) 17 GM/SCOOP powder Take 17 g by mouth daily as needed for moderate constipation. 12/02/19   Harvest Dark, MD  timolol (BETIMOL) 0.5 % ophthalmic solution Place 1 drop into both eyes 2 (two) times daily. 10/07/19   Inda Coke, PA    Physical Exam: Vitals:   12/02/19 0510 12/02/19 1100  BP: (!) 177/91   Pulse: 77   Resp: 18   Temp: (!) 97.1 F (36.2 C)   TempSrc: Axillary   SpO2: 97%   Weight:  68.7 kg  Height:  5\' 7"  (1.702 m)   General: Not in acute distress HEENT:       Eyes: PERRL, EOMI, no scleral icterus.       ENT: No discharge from the ears and nose, no pharynx injection, no tonsillar enlargement.        Neck: No JVD, no bruit, no mass felt. Heme: No neck lymph node enlargement. Cardiac: S1/S2, RRR, No murmurs, No gallops or rubs. Respiratory: No rales, wheezing, rhonchi or rubs. GI: Soft, nondistended, has mild tenderness in bilateral lower abdomen, no rebound pain, no organomegaly, BS present. GU: No hematuria Ext: No pitting leg edema bilaterally. 2+DP/PT pulse bilaterally. Musculoskeletal: No joint deformities, No joint redness or warmth. Has tenderness in the midline of lower back Skin: No rashes.  Neuro: Alert, oriented X3, cranial nerves II-XII grossly intact, moves all extremities normally.  Psych: Patient is not psychotic, no suicidal or hemocidal ideation.  Labs on Admission: I have personally reviewed following labs and imaging studies  CBC: Recent Labs  Lab 12/02/19 0627  WBC 10.9*  HGB 14.1  HCT 45.7  MCV 96.4  PLT [id number]   Basic Metabolic Panel: Recent Labs  Lab 12/02/19 0627  NA 138  K 4.3  CL 105  CO2 22  GLUCOSE 111*  BUN 13  CREATININE 0.73  CALCIUM 9.3   GFR: Estimated Creatinine Clearance: 50 mL/min (by C-G formula based on SCr of 0.73 mg/dL). Liver Function Tests: No results for input(s): AST, ALT, ALKPHOS, BILITOT, PROT, ALBUMIN in the last 168 hours. No results for input(s): LIPASE, AMYLASE in the  last 168 hours. No results for input(s): AMMONIA in the last 168 hours. Coagulation Profile: No results for input(s): INR, PROTIME in the last 168 hours. Cardiac Enzymes: No results for input(s): CKTOTAL, CKMB, CKMBINDEX, TROPONINI in the last 168 hours. BNP (last 3 results) No results for input(s): PROBNP in the last 8760 hours. HbA1C: No results for input(s): HGBA1C in the last 72 hours. CBG: No results for input(s): GLUCAP in the last 168 hours. Lipid Profile: No results for input(s): CHOL, HDL, LDLCALC, TRIG, CHOLHDL, LDLDIRECT in the last 72 hours. Thyroid Function Tests: No results for input(s): TSH, T4TOTAL, FREET4, T3FREE, THYROIDAB in the last 72 hours. Anemia Panel: No results for input(s): VITAMINB12, FOLATE, FERRITIN, TIBC, IRON, RETICCTPCT in the last 72 hours. Urine analysis: No results found for: COLORURINE, APPEARANCEUR, LABSPEC, PHURINE, GLUCOSEU, HGBUR, BILIRUBINUR, KETONESUR, PROTEINUR, UROBILINOGEN, NITRITE, LEUKOCYTESUR Sepsis Labs: @LABRCNTIP (procalcitonin:4,lacticidven:4) )No results found for this or any previous visit (from the past 240 hour(s)).   Radiological Exams on Admission:  CT Renal Stone Study  Result Date: 12/02/2019 CLINICAL DATA:  Flank pain and constipation. Patient reports no bowel movement for 2 weeks and is incontinent. EXAM: CT ABDOMEN AND PELVIS WITHOUT CONTRAST TECHNIQUE: Multidetector CT imaging of the abdomen and pelvis was performed following the standard protocol without IV contrast. COMPARISON:  None. FINDINGS: Lower chest: Subsegmental atelectasis in the right lower lobe. No pleural fluid. Small hiatal hernia. Normal heart size. Hepatobiliary: Multiple noncalcified gallstones in the gallbladder. No pericholecystic fluid. No evidence of focal lesion on noncontrast exam. Pancreas: Parenchymal atrophy. No ductal dilatation or inflammation. Spleen: Normal in size without focal abnormality. Adrenals/Urinary Tract: 2.4 cm right adrenal adenoma.  Mild left adrenal thickening without dominant lesion. Right kidney slightly malrotated directed anteriorly. Mild right renal atrophy. No hydronephrosis or perinephric edema. No renal stones. Urinary bladder is partially distended. Bladder is trabeculated with right lateral bladder diverticulum. No bladder wall thickening. Stomach/Bowel: Small hiatal hernia. Stomach is nondistended. Duodenal diverticulum without inflammation. No small bowel obstruction or abnormal distention. Normal appendix. Large colonic stool burden. Large amount of stool throughout the entire colon. Sigmoid colon is redundant. Mild wall thickening with pericolonic edema in the mid sigmoid colon series 2, image 52. Small volume of stool in the rectum. Vascular/Lymphatic: Aortic atherosclerosis and tortuosity. No aortic aneurysm. No bulky abdominopelvic adenopathy, evaluation limited in the absence of contrast. Reproductive: Uterus and bilateral adnexa are unremarkable. Other: No free air, free fluid, or intra-abdominal fluid collection. Musculoskeletal: T12 compression fracture which is recently assessed on thoracic spine CT 11/18/2019, unchanged no new osseous abnormalities. Mild scoliosis and degenerative change in the lumbar spine. Prior L3-L5 laminectomy on the left. IMPRESSION: 1. Large colonic stool burden throughout the colon consistent with constipation. Focal area of colonic wall thickening in the mid sigmoid with pericolonic edema may represent focal colitis, including stercoral disease. 2. No hydronephrosis or obstructive uropathy. Urinary bladder is mildly trabeculated with bladder diverticulum on the right. 3. Incidental findings of cholelithiasis, right adrenal adenoma, and small hiatal hernia. Aortic Atherosclerosis (ICD10-I70.0). Electronically Signed   By: Keith Rake M.D.   On: 12/02/2019 06:21     EKG: Not done in ED, will get one.   Assessment/Plan Principal Problem:   Constipation Active Problems:    Hypothyroidism   Lower back pain   Constipation: CT scan showed large colonic stool burden throughout the colon consistent with constipation. CT scan also showed focal area of colonic wall thickening in the mid sigmoid with pericolonic edema may represent focal colitis, including stercoral disease, but pt does not have fever.  She only has mild leukocytosis with WBC 10.9.  I will hold antibiotics and observe patient closely. EDP discussed with general surgeon, Dr. Bing Matter. Since patient has no peritoneal signs on exam, okay to proceed with bowel regimen per Dr. Bing Matter.   -will place on med-surg bed for obs -pt received one dose of senna, MiraLAX and magnesium citrate  -continue senokot twice daily and MiraLAX twice daily -if no improvement, will give enama  Hypothyroidism:  -Synthroid  Lower back pain: this is a chronic issue. She has some alarm symptoms, but she is currently seeing a spine specialist and is undergoing consideration for kyphoplasty. Patient states she had an MRI performed approximately 2 or 3 weeks ago for the same and is waiting to follow-up with her spine specialist -prn perocet -prn Robaxin -f/u with her spine specialist   DVT ppx: SQ Lovenox Code Status: DNR (I discussed with patient and explained the meaning of [place].  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  She wants to be be DNR) Family Communication: None at bed side.    Disposition Plan:  Anticipate discharge back to previous home environment Consults called:  none Admission status: Med-surg bed for obs   Date of Service 12/02/2019    Ivor Costa [facility]   If 7PM-7AM, please contact night-coverage [email or web address] 12/02/2019, 11:31 AM

## 2019-12-02 NOTE — ED Notes (Signed)
MD at bedside. 

## 2019-12-02 NOTE — Discharge Instructions (Signed)
Please take four 5 mg Dulcolax tablets (available at any pharmacy) and mix your entire 225 g bottle of MiraLAX and mixed for 64 ounces of Gatorade.  Please drink this over 8 hours.  Please follow-up with GI medicine by calling the number provided.  Return to the emergency department for any worsening abdominal pain, or any other symptom personally concerning to yourself.

## 2019-12-02 NOTE — ED Triage Notes (Addendum)
Patient gotten out of POV for fall 1 month ago and states she has compression fracture of her lower back. Patient states she has not had a bowl movement in 2 weeks and states she says is incontinent. States she does not feel her hips of her big toes.  States she has taken everything OTC to help move her bowels.

## 2019-12-02 NOTE — ED Provider Notes (Signed)
[facility] Emergency Department Provider Note  Time seen: 5:33 AM  I have reviewed the triage vital signs and the nursing notes.   HISTORY  Chief Complaint Fall   HPI Teresa Hahn is a 86 y.o. female with a past medical history of Graves' disease, hypothyroidism, presents to the emergency department for back pain and constipation.  According to the patient she has had several falls over the past 1 year, states a major fall 8 months ago than a small fall approximately 1 month ago.  Patient states she has been diagnosed with compression fractures of lower back, is currently seeing a spine specialist and is undergoing consideration for kyphoplasty.  Patient states over the past month she has been experiencing increased urinary incontinence although states for many months or years she has had some incontinence.  She also states at times she will feel tingling and decreased sensation in her big toes bilaterally.  Patient states that has been going on for greater than a month as well.  Patient states she had an MRI performed approximately 2 or 3 weeks ago for the same and is waiting to follow-up with her spine specialist.  Patient states over the past 2 weeks she has been experiencing some discomfort mostly due to constipation.  States she is only had 1 bowel movement in 2 weeks which is very abnormal for her.  Patient states she has tried over-the-counter laxatives suppositories without success so she came to the emergency department.  Patient states mild left-sided abdominal pain which again she relates constipation.  States 10 out of 10 lower back pain which has been an ongoing issue for her.   Past Medical History:  Diagnosis Date  . Cataract   . Glaucoma   . Graves disease   . Hypothyroidism   . Meningitis    Viral  . Shingles    has had 4 times  . Vitamin B12 deficiency   . Vitamin D deficiency     Patient Active Problem List   Diagnosis Date Noted  .  Compression fracture of T12 vertebra ([place]) 11/04/2019  . Hypothyroidism 10/30/2019  . Vitamin D deficiency, unspecified 03/04/2016  . B12 deficiency 03/04/2016  . H/O lumbosacral spine surgery 03/03/2016  . History of Graves' disease 07/07/2015  . Glaucoma 07/07/2015    Past Surgical History:  Procedure Laterality Date  . CATARACT EXTRACTION, BILATERAL    . SPINAL CORD DECOMPRESSION     L3-5  . THYROID SURGERY      Prior to Admission medications   Medication Sig Start Date End Date Taking? Authorizing Provider  levothyroxine (SYNTHROID) 50 MCG tablet Take 1 tablet (50 mcg total) by mouth daily. 10/31/19   Inda Coke, PA  timolol (BETIMOL) 0.5 % ophthalmic solution Place 1 drop into both eyes 2 (two) times daily. 10/07/19   Inda Coke, PA    Allergies  Allergen Reactions  . Cyclobenzaprine Other (See Comments)    Mental lethargy    Family History  Problem Relation Age of Onset  . Alcohol abuse Mother   . Alcohol abuse Father     Social History Social History   Tobacco Use  . Smoking status: Never Smoker  . Smokeless tobacco: Never Used  Substance Use Topics  . Alcohol use: Never  . Drug use: Never    Review of Systems Constitutional: Negative for fever. Cardiovascular: Negative for chest pain. Respiratory: Negative for shortness of breath. Gastrointestinal: Mild left-sided abdominal pain.  Positive for constipation.  Negative for  vomiting. Genitourinary: Negative for urinary compaints Musculoskeletal: 10/10 lower back pain, chronic. Neurological: Negative for headache All other ROS negative  ____________________________________________   PHYSICAL EXAM:  VITAL SIGNS: ED Triage Vitals  Enc Vitals Group     BP 12/02/19 0510 (!) 177/91     Pulse Rate 12/02/19 0510 77     Resp 12/02/19 0510 18     Temp 12/02/19 0510 (!) 97.1 F (36.2 C)     Temp Source 12/02/19 0510 Axillary     SpO2 12/02/19 0510 97 %     Weight --      Height --       Head Circumference --      Peak Flow --      Pain Score 12/02/19 0512 10     Pain Loc --      Pain Edu? --      Excl. in [place]? --     Constitutional: Alert and oriented. Well appearing and in no distress. Eyes: Normal exam ENT      Head: Normocephalic and atraumatic.      Mouth/Throat: Mucous membranes are moist. Cardiovascular: Normal rate, regular rhythm. Respiratory: Normal respiratory effort without tachypnea nor retractions. Breath sounds are clear Gastrointestinal: Soft mild left lower quadrant tenderness.. No distention.  Rectal examination shows no stool in the rectal vault.  No sign of fecal impaction. Musculoskeletal: Mild to moderate L-spine tenderness palpation. Neurologic:  Normal speech and language.  Skin:  Skin is warm, dry and intact.  Psychiatric: Mood and affect are normal.    RADIOLOGY  IMPRESSION:  1. Large colonic stool burden throughout the colon consistent with  constipation. Focal area of colonic wall thickening in the mid  sigmoid with pericolonic edema may represent focal colitis,  including stercoral disease.  2. No hydronephrosis or obstructive uropathy. Urinary bladder is  mildly trabeculated with bladder diverticulum on the right.  3. Incidental findings of cholelithiasis, right adrenal adenoma, and  small hiatal hernia.   Aortic Atherosclerosis (ICD10-I70.0).   ____________________________________________   INITIAL IMPRESSION / ASSESSMENT AND PLAN / ED COURSE  Pertinent labs & imaging results that were available during my care of the patient were reviewed by me and considered in my medical decision making (see chart for details).   Patient presents to the emergency department for back pain and constipation.  Patient also states issues of intermittent tingling in her toes and increased urinary incontinence but states these issues have been ongoing for at least a month and the incontinence has been ongoing for years has had an MRI she states  approximately 2 weeks ago by her spine specialist which she is following up regarding the back pain.  Overall the patient appears very well.  Rectal exam shows no sign of fecal impaction.  We will perform a CT scan and basic labs to evaluate.  If CT shows significant constipation we will proceed with enema.  Patient agreeable to plan of care.  Patient care signed out to oncoming provider.  Supreet Tim was evaluated in Emergency Department on 12/02/2019 for the symptoms described in the history of present illness. She was evaluated in the context of the global COVID-19 pandemic, which necessitated consideration that the patient might be at risk for infection with the SARS-CoV-2 virus that causes COVID-19. Institutional protocols and algorithms that pertain to the evaluation of patients at risk for COVID-19 are in a state of rapid change based on information released by regulatory bodies including the CDC and federal and state organizations.  These policies and algorithms were followed during the patient's care in the ED.  ____________________________________________   FINAL CLINICAL IMPRESSION(S) / ED DIAGNOSES  Back pain Constipation   Harvest Dark, MD 12/04/19 1304

## 2019-12-03 ENCOUNTER — Other Ambulatory Visit: Payer: Self-pay

## 2019-12-03 ENCOUNTER — Encounter: Payer: Self-pay | Admitting: Internal Medicine

## 2019-12-03 DIAGNOSIS — K5900 Constipation, unspecified: Secondary | ICD-10-CM | POA: Diagnosis not present

## 2019-12-03 LAB — CBC
HCT: 39.5 % (ref 36.0–46.0)
Hemoglobin: 13 g/dL (ref 12.0–15.0)
MCH: 29.7 pg (ref 26.0–34.0)
MCHC: 32.9 g/dL (ref 30.0–36.0)
MCV: 90.4 fL (ref 80.0–100.0)
Platelets: 291 10*3/uL (ref 150–400)
RBC: 4.37 MIL/uL (ref 3.87–5.11)
RDW: 13.7 % (ref 11.5–15.5)
WBC: 9.5 10*3/uL (ref 4.0–10.5)
nRBC: 0 % (ref 0.0–0.2)

## 2019-12-03 LAB — BASIC METABOLIC PANEL
Anion gap: 7 (ref 5–15)
BUN: 16 mg/dL (ref 8–23)
CO2: 29 mmol/L (ref 22–32)
Calcium: 9.2 mg/dL (ref 8.9–10.3)
Chloride: 101 mmol/L (ref 98–111)
Creatinine, Ser: 0.77 mg/dL (ref 0.44–1.00)
GFR calc Af Amer: >60 mL/min (ref >60)
GFR calc non Af Amer: >60 mL/min (ref >60)
Glucose, Bld: 99 mg/dL (ref 70–99)
Potassium: 4.3 mmol/L (ref 3.5–5.1)
Sodium: 137 mmol/L (ref 135–145)

## 2019-12-03 LAB — SARS CORONAVIRUS 2 (TAT 6-24 HRS): SARS Coronavirus 2: NEGATIVE

## 2019-12-03 MED ORDER — POLYETHYLENE GLYCOL 3350 17 G PO PACK
34.0000 g | PACK | ORAL | Status: AC
  Administered 2019-12-03 (×3): 34 g via ORAL
  Filled 2019-12-03 (×3): qty 2

## 2019-12-03 NOTE — Progress Notes (Signed)
PROGRESS NOTE    Teresa Hahn  [id number] DOB: [birth year] DOA: 12/02/2019 PCP: Teresa Coke, PA    Assessment & Plan:   Principal Problem:   Constipation Active Problems:   Hypothyroidism   Lower back pain    Teresa Hahn is a 86 y.o. Caucasian female with medical history significant of hypothyroidism, Graves' disease, vitamin D deficiency, vitamin B12 deficiency, viral meningitis, glaucoma, chronic back pain, who presents with constipation and back pain.   Constipation: CT scan showed large colonic stool burden throughout the colon consistent with constipation. CT scan also showed focal area of colonic wall thickening in the mid sigmoid with pericolonic edema may represent focal colitis, including stercoral disease, but pt does not have fever.  She only has mild leukocytosis with WBC 10.9.  I will hold antibiotics and observe patient closely. EDP discussed with general surgeon, Dr. Bing Matter. Since patient has no peritoneal signs on exam, okay to proceed with bowel regimen per Dr. Bing Matter.  -pt received one dose of senna, MiraLAX and magnesium citrate  --High-dose Miralax 34g q2h for 5 doses, until large BM.  Hypothyroidism:  -Synthroid  Lower back pain: this is a chronic issue. She has some alarm symptoms, but she is currently seeing a spine specialist and is undergoing consideration for kyphoplasty. Patient states she had an MRI performed approximately 2 or 3 weeks ago for the same and is waiting to follow-up with her spine specialist -prn perocet -prn Robaxin -f/u with her spine specialist   DVT prophylaxis: Lovenox SQ Code Status: DNR  Family Communication: not today Disposition Plan: Home when constipation resolves, maybe tomorrow   Subjective and Interval History:  Complained of abdominal tenderness, pain in her lower back.  No fever, dyspnea, chest pain, N/V/D, dysuria.    Objective: Vitals:   12/03/19 0042 12/03/19 0733 12/03/19 1640 12/04/19 0018  BP:  104/62 123/73 133/74 122/77  Pulse: 68 77 68 94  Resp: 16   16  Temp: 98.2 F (36.8 C) 98.7 F (37.1 C) 98.3 F (36.8 C) 98.8 F (37.1 C)  TempSrc: Oral Oral Oral Oral  SpO2: 93% 94% 95% 94%  Weight:      Height:        Intake/Output Summary (Last 24 hours) at 12/04/2019 0210 Last data filed at 12/03/2019 2300 Gross per 24 hour  Intake 960 ml  Output 2100 ml  Net -1140 ml   Filed Weights   12/02/19 1100 12/02/19 2041  Weight: 68.7 kg 59.7 kg    Examination:   Constitutional: NAD, AAOx3 HEENT: conjunctivae and lids normal, EOMI CV: RRR no M,R,G. Distal pulses +2.  No cyanosis.   RESP: CTA B/L, normal respiratory effort  GI: +BS, tender to palpation Extremities: No effusions, edema, or tenderness in BLE SKIN: warm, dry and intact Neuro: II - XII grossly intact.  Sensation intact Psych: Normal mood and affect.  Appropriate judgement and reason   Data Reviewed: I have personally reviewed following labs and imaging studies  CBC: Recent Labs  Lab 12/02/19 0627 12/03/19 0528  WBC 10.9* 9.5  HGB 14.1 13.0  HCT 45.7 39.5  MCV 96.4 90.4  PLT 264 [id number]   Basic Metabolic Panel: Recent Labs  Lab 12/02/19 0627 12/03/19 0528  NA 138 137  K 4.3 4.3  CL 105 101  CO2 22 29  GLUCOSE 111* 99  BUN 13 16  CREATININE 0.73 0.77  CALCIUM 9.3 9.2  MG 2.4  --    GFR: Estimated Creatinine Clearance: 48.5 mL/min (by C-G  formula based on SCr of 0.77 mg/dL). Liver Function Tests: No results for input(s): AST, ALT, ALKPHOS, BILITOT, PROT, ALBUMIN in the last 168 hours. No results for input(s): LIPASE, AMYLASE in the last 168 hours. No results for input(s): AMMONIA in the last 168 hours. Coagulation Profile: No results for input(s): INR, PROTIME in the last 168 hours. Cardiac Enzymes: No results for input(s): CKTOTAL, CKMB, CKMBINDEX, TROPONINI in the last 168 hours. BNP (last 3 results) No results for input(s): PROBNP in the last 8760 hours. HbA1C: No results for input(s):  HGBA1C in the last 72 hours. CBG: No results for input(s): GLUCAP in the last 168 hours. Lipid Profile: No results for input(s): CHOL, HDL, LDLCALC, TRIG, CHOLHDL, LDLDIRECT in the last 72 hours. Thyroid Function Tests: No results for input(s): TSH, T4TOTAL, FREET4, T3FREE, THYROIDAB in the last 72 hours. Anemia Panel: No results for input(s): VITAMINB12, FOLATE, FERRITIN, TIBC, IRON, RETICCTPCT in the last 72 hours. Sepsis Labs: No results for input(s): PROCALCITON, LATICACIDVEN in the last 168 hours.  Recent Results (from the past 240 hour(s))  SARS CORONAVIRUS 2 (TAT 6-24 HRS) Nasopharyngeal Nasopharyngeal Swab     Status: None   Collection Time: 12/02/19 12:02 PM   Specimen: Nasopharyngeal Swab  Result Value Ref Range Status   SARS Coronavirus 2 NEGATIVE NEGATIVE Final    Comment: (NOTE) SARS-CoV-2 target nucleic acids are NOT DETECTED. The SARS-CoV-2 RNA is generally detectable in upper and lower respiratory specimens during the acute phase of infection. Negative results do not preclude SARS-CoV-2 infection, do not rule out co-infections with other pathogens, and should not be used as the sole basis for treatment or other patient management decisions. Negative results must be combined with clinical observations, patient history, and epidemiological information. The expected result is Negative. Fact Sheet for Patients: SugarRoll.be Fact Sheet for Healthcare Providers: https://www.woods-mathews.com/ This test is not yet approved or cleared by the [place] FDA and  has been authorized for detection and/or diagnosis of SARS-CoV-2 by FDA under an Emergency Use Authorization (EUA). This EUA will remain  in effect (meaning this test can be used) for the duration of the COVID-19 declaration under Section 56 4(b)(1) of the Act, 21 U.S.C. section 360bbb-3(b)(1), unless the authorization is terminated or revoked sooner. Performed at [facility], [place] [address] [place], [place] [address]       Radiology Studies: CT Renal Stone Study  Result Date: 12/02/2019 CLINICAL DATA:  Flank pain and constipation. Patient reports no bowel movement for 2 weeks and is incontinent. EXAM: CT ABDOMEN AND PELVIS WITHOUT CONTRAST TECHNIQUE: Multidetector CT imaging of the abdomen and pelvis was performed following the standard protocol without IV contrast. COMPARISON:  None. FINDINGS: Lower chest: Subsegmental atelectasis in the right lower lobe. No pleural fluid. Small hiatal hernia. Normal heart size. Hepatobiliary: Multiple noncalcified gallstones in the gallbladder. No pericholecystic fluid. No evidence of focal lesion on noncontrast exam. Pancreas: Parenchymal atrophy. No ductal dilatation or inflammation. Spleen: Normal in size without focal abnormality. Adrenals/Urinary Tract: 2.4 cm right adrenal adenoma. Mild left adrenal thickening without dominant lesion. Right kidney slightly malrotated directed anteriorly. Mild right renal atrophy. No hydronephrosis or perinephric edema. No renal stones. Urinary bladder is partially distended. Bladder is trabeculated with right lateral bladder diverticulum. No bladder wall thickening. Stomach/Bowel: Small hiatal hernia. Stomach is nondistended. Duodenal diverticulum without inflammation. No small bowel obstruction or abnormal distention. Normal appendix. Large colonic stool burden. Large amount of stool throughout the entire colon. Sigmoid colon is redundant. Mild  wall thickening with pericolonic edema in the mid sigmoid colon series 2, image 52. Small volume of stool in the rectum. Vascular/Lymphatic: Aortic atherosclerosis and tortuosity. No aortic aneurysm. No bulky abdominopelvic adenopathy, evaluation limited in the absence of contrast. Reproductive: Uterus and bilateral adnexa are unremarkable. Other: No free air, free fluid, or intra-abdominal fluid collection. Musculoskeletal: T12 compression  fracture which is recently assessed on thoracic spine CT 11/18/2019, unchanged no new osseous abnormalities. Mild scoliosis and degenerative change in the lumbar spine. Prior L3-L5 laminectomy on the left. IMPRESSION: 1. Large colonic stool burden throughout the colon consistent with constipation. Focal area of colonic wall thickening in the mid sigmoid with pericolonic edema may represent focal colitis, including stercoral disease. 2. No hydronephrosis or obstructive uropathy. Urinary bladder is mildly trabeculated with bladder diverticulum on the right. 3. Incidental findings of cholelithiasis, right adrenal adenoma, and small hiatal hernia. Aortic Atherosclerosis (ICD10-I70.0). Electronically Signed   By: Keith Rake M.D.   On: 12/02/2019 06:21     Scheduled Meds:  calcium-vitamin D  1 tablet Oral Daily   enoxaparin (LOVENOX) injection  40 mg Subcutaneous Q24H   levothyroxine  50 mcg Oral Q0600   polyethylene glycol  17 g Oral BID   timolol  1 drop Both Eyes BID   Continuous Infusions:   LOS: 0 days     Enzo Bi, MD [facility] If 7PM-7AM, please contact night-coverage 12/04/2019, 2:10 AM

## 2019-12-03 NOTE — TOC Initial Note (Signed)
Transition of Care [facility]) - Initial/Assessment Note    Patient Details  Name: Teresa Hahn MRN: [id number] Date of Birth: 09/03/1932  Transition of Care [facility]) CM/SW Contact:    Su Hilt, RN Phone Number: 12/03/2019, 4:17 PM  Clinical Narrative:                  Met with the patient to complete the MOON,  Discussed with the patient DC plan and needs She lives at home where she cares for her 84 year old husband Her daughter lives there as well and her 48 year old grandson She stated that she is doing the cooking and cleaning and her daughter helps as she can, she feels that between the daughter and herself they may be able to hire a housekeeper once or twice a week, she will speak to her daughter about it.SHe stated that she wanted to speak to her insurance to see if they will send someone to help a few times a week, I explained that typically insurance does not cover a personal care assistant I provided her with a resource book for [employer] She declined and refused Childress services She refused DME saying she jhas a cane at home and that she is afraid that a RW would make her weaker, I demonstrated a RW and how it is used, she declined still, she said she mainly needs help with cooking, She stated that her daughter makes a large meal once a week and they eat that all weak I asked if she has ever checked on [employer], she said no but she would like to call them when she gets home, I offered to call them and do the referral and she stated she would like to call them with her daughter herself.  I provided the number Expected Discharge Plan: Home/Self Care Barriers to Discharge: Continued Medical Work up   Patient Goals and CMS Choice Patient states their goals for this hospitalization and ongoing recovery are:: go home      Expected Discharge Plan and Services Expected Discharge Plan: Home/Self Care   Discharge Planning Services: CM Consult, Mobile Meals   Living  arrangements for the past 2 months: Single Family Home                 DME Arranged: Patient refused services         HH Arranged: Patient Refused HH          Prior Living Arrangements/Services Living arrangements for the past 2 months: Single Family Home Lives with:: Adult Children, Relatives, Spouse(her husband is 36, her grandson who is 40 and her daughter that works 11 hour days) Patient language and need for interpreter reviewed:: Yes Do you feel safe going back to the place where you live?: Yes      Need for Family Participation in Patient Care: No (Comment) Care giver support system in place?: Yes (comment)   Criminal Activity/Legal Involvement Pertinent to Current Situation/Hospitalization: No - Comment as needed  Activities of Daily Living Home Assistive Devices/Equipment: Shower chair with back, Cane (specify quad or straight) ADL Screening (condition at time of admission) Patient's cognitive ability adequate to safely complete daily activities?: Yes Is the patient deaf or have difficulty hearing?: No Does the patient have difficulty seeing, even when wearing glasses/contacts?: No Does the patient have difficulty concentrating, remembering, or making decisions?: No Patient able to express need for assistance with ADLs?: Yes Does the patient have difficulty dressing or bathing?: No  Independently performs ADLs?: Yes (appropriate for developmental age) Does the patient have difficulty walking or climbing stairs?: No(N/A @ home) Weakness of Legs: Right(Uses cane @ home) Weakness of Arms/Hands: None  Permission Sought/Granted   Permission granted to share information with : Yes, Verbal Permission Granted              Emotional Assessment Appearance:: Appears stated age Attitude/Demeanor/Rapport: Engaged Affect (typically observed): Appropriate Orientation: : Oriented to Self, Oriented to Situation, Oriented to Place, Oriented to  Time Alcohol / Substance Use:  Not Applicable Psych Involvement: No (comment)  Admission diagnosis:  Constipation [K59.00] Constipation, unspecified constipation type [K59.00] Patient Active Problem List   Diagnosis Date Noted  . Lower back pain 12/02/2019  . Constipation 12/02/2019  . Compression fracture of T12 vertebra ([place]) 11/04/2019  . Hypothyroidism 10/30/2019  . Vitamin D deficiency, unspecified 03/04/2016  . B12 deficiency 03/04/2016  . H/O lumbosacral spine surgery 03/03/2016  . History of Graves' disease 07/07/2015  . Glaucoma 07/07/2015   PCP:  Inda Coke, PA Pharmacy:   [facility] [place], [place] [address] [place] [address] Phone: [phone] Fax: [phone]     Social Determinants of Health (SDOH) Interventions    Readmission Risk Interventions No flowsheet data found.

## 2019-12-03 NOTE — Care Management Obs Status (Signed)
[place] NOTIFICATION   Patient Details  Name: Teresa Hahn MRN: [id number] Date of Birth: 12-15-1931   Medicare Observation Status Notification Given:  Yes    Esequiel Kleinfelter Jen Mow, RN 12/03/2019, 4:14 PM

## 2019-12-03 NOTE — Plan of Care (Signed)
  Problem: Coping: Goal: Level of anxiety will decrease Outcome: Completed/Met

## 2019-12-04 ENCOUNTER — Telehealth: Payer: Self-pay | Admitting: Family Medicine

## 2019-12-04 DIAGNOSIS — H409 Unspecified glaucoma: Secondary | ICD-10-CM | POA: Diagnosis present

## 2019-12-04 DIAGNOSIS — K5641 Fecal impaction: Secondary | ICD-10-CM | POA: Diagnosis present

## 2019-12-04 DIAGNOSIS — Z66 Do not resuscitate: Secondary | ICD-10-CM | POA: Diagnosis present

## 2019-12-04 DIAGNOSIS — Z888 Allergy status to other drugs, medicaments and biological substances status: Secondary | ICD-10-CM | POA: Diagnosis not present

## 2019-12-04 DIAGNOSIS — R296 Repeated falls: Secondary | ICD-10-CM | POA: Diagnosis present

## 2019-12-04 DIAGNOSIS — K5900 Constipation, unspecified: Secondary | ICD-10-CM | POA: Diagnosis present

## 2019-12-04 DIAGNOSIS — E0500 Thyrotoxicosis with diffuse goiter without thyrotoxic crisis or storm: Secondary | ICD-10-CM | POA: Diagnosis present

## 2019-12-04 DIAGNOSIS — E039 Hypothyroidism, unspecified: Secondary | ICD-10-CM | POA: Diagnosis present

## 2019-12-04 DIAGNOSIS — M4854XA Collapsed vertebra, not elsewhere classified, thoracic region, initial encounter for fracture: Secondary | ICD-10-CM | POA: Diagnosis present

## 2019-12-04 DIAGNOSIS — R202 Paresthesia of skin: Secondary | ICD-10-CM | POA: Diagnosis present

## 2019-12-04 DIAGNOSIS — N200 Calculus of kidney: Secondary | ICD-10-CM | POA: Diagnosis present

## 2019-12-04 DIAGNOSIS — Z20822 Contact with and (suspected) exposure to covid-19: Secondary | ICD-10-CM | POA: Diagnosis present

## 2019-12-04 DIAGNOSIS — R32 Unspecified urinary incontinence: Secondary | ICD-10-CM | POA: Diagnosis present

## 2019-12-04 DIAGNOSIS — Z79890 Hormone replacement therapy (postmenopausal): Secondary | ICD-10-CM | POA: Diagnosis not present

## 2019-12-04 LAB — CBC
HCT: 41.1 % (ref 36.0–46.0)
Hemoglobin: 13.7 g/dL (ref 12.0–15.0)
MCH: 30.4 pg (ref 26.0–34.0)
MCHC: 33.3 g/dL (ref 30.0–36.0)
MCV: 91.1 fL (ref 80.0–100.0)
Platelets: 286 10*3/uL (ref 150–400)
RBC: 4.51 MIL/uL (ref 3.87–5.11)
RDW: 13.3 % (ref 11.5–15.5)
WBC: 10.9 10*3/uL — ABNORMAL HIGH (ref 4.0–10.5)
nRBC: 0 % (ref 0.0–0.2)

## 2019-12-04 LAB — BASIC METABOLIC PANEL
Anion gap: 8 (ref 5–15)
BUN: 10 mg/dL (ref 8–23)
CO2: 28 mmol/L (ref 22–32)
Calcium: 9 mg/dL (ref 8.9–10.3)
Chloride: 102 mmol/L (ref 98–111)
Creatinine, Ser: 0.68 mg/dL (ref 0.44–1.00)
GFR calc Af Amer: >60 mL/min (ref >60)
GFR calc non Af Amer: >60 mL/min (ref >60)
Glucose, Bld: 118 mg/dL — ABNORMAL HIGH (ref 70–99)
Potassium: 4.1 mmol/L (ref 3.5–5.1)
Sodium: 138 mmol/L (ref 135–145)

## 2019-12-04 LAB — MAGNESIUM: Magnesium: 2.3 mg/dL (ref 1.7–2.4)

## 2019-12-04 MED ORDER — TRAZODONE HCL 50 MG PO TABS: 50.0000 mg | ORAL_TABLET | Freq: Every evening | ORAL | Status: DC | PRN

## 2019-12-04 MED ORDER — POLYETHYLENE GLYCOL 3350 17 G PO PACK
34.0000 g | PACK | ORAL | Status: AC
  Administered 2019-12-04 – 2019-12-05 (×3): 34 g via ORAL
  Filled 2019-12-04 (×3): qty 2

## 2019-12-04 MED ORDER — POLYETHYLENE GLYCOL 3350 17 G PO PACK
34.0000 g | PACK | ORAL | Status: DC
  Administered 2019-12-04 (×2): 34 g via ORAL
  Filled 2019-12-04 (×2): qty 2

## 2019-12-04 MED ORDER — SORBITOL 70 % SOLN
960.0000 mL | TOPICAL_OIL | Freq: Once | ORAL | Status: AC
  Administered 2019-12-04: 960 mL via RECTAL
  Filled 2019-12-04: qty 240

## 2019-12-04 NOTE — Telephone Encounter (Signed)
I do not think that is necessary. Thank you for offering.

## 2019-12-04 NOTE — Progress Notes (Signed)
PROGRESS NOTE    Teresa Hahn  [id number] DOB: [birth year] DOA: 12/02/2019 PCP: Inda Coke, PA    Assessment & Plan:   Principal Problem:   Constipation Active Problems:   Hypothyroidism   Lower back pain   Impacted stool in intestine ([place])    Teresa Hahn is a 86 y.o. Caucasian female with medical history significant of hypothyroidism, Graves' disease, vitamin D deficiency, vitamin B12 deficiency, viral meningitis, glaucoma, chronic back pain, who presents with constipation and back pain.   Abdominal pain and poor appetitie 2/2 severe Constipation and stool impaction CT scan showed large colonic stool burden throughout the colon consistent with constipation. CT scan also showed focal area of colonic wall thickening in the mid sigmoid with pericolonic edema may represent focal colitis, including stercoral disease, but pt does not have fever.  She only has mild leukocytosis with WBC 10.9.  I will hold antibiotics and observe patient closely. EDP discussed with general surgeon, Dr. Bing Matter. Since patient has no peritoneal signs on exam, okay to proceed with bowel regimen per Dr. Bing Matter.  -pt received one dose of senna, MiraLAX and magnesium citrate  --High-dose Miralax 34g q2h for 5 doses per day and enema finally produced large BM. PLAN: --continue high-dose Miralax because pt likely has more stool to clean out --Avoid opioids pain meds   Hypothyroidism:  -Synthroid  Lower back pain: this is a chronic issue. She has some alarm symptoms, but she is currently seeing a spine specialist and is undergoing consideration for kyphoplasty. Patient states she had an MRI performed approximately 2 or 3 weeks ago for the same and is waiting to follow-up with her spine specialist -prn Robaxin -f/u with her spine specialist   DVT prophylaxis: Lovenox SQ Code Status: DNR  Family Communication: not today Disposition Plan: Pt presented with severe constipation filling her entire  colon, and likely stool impaction as well, which was causing stabbing pain and cramping when the bowels were trying to move.  Due to severe constipation, pt has had poor PO intake, though didn't have signs of dehydration, so IVF wasn't ordered.  Didn't order opioids pain meds for pt's abdominal pain in order to avoid exacerbating constipation.  Pt is frail and weak and would not be able to relieve this degree of stool impaction on her own at home, so is kept in the hospital so we can help clean out her bowels.  May be able to discharge home tomorrow if we have made enough progress in cleaning out her stool burden and if pt can maintain PO intake.   Subjective and Interval History:  After the high-dose Miralax yesterday, pt had 1 episode of medium-size BM.  Today still complained of abdominal pain that's stabbing and cramping.  Complained of enlarged abdomen.  Poor PO intake.  No fever, dyspnea, chest pain, vomiting, dysuria.  Re-ordered high-dose Miralax today as well as enema, which resulted in more large BM's, and pt reported feeling better afterwards.   Objective: Vitals:   12/03/19 0733 12/03/19 1640 12/04/19 0018 12/04/19 1700  BP: 123/73 133/74 122/77 133/80  Pulse: 77 68 94 72  Resp:   16   Temp: 98.7 F (37.1 C) 98.3 F (36.8 C) 98.8 F (37.1 C) 97.7 F (36.5 C)  TempSrc: Oral Oral Oral Oral  SpO2: 94% 95% 94% 96%  Weight:      Height:        Intake/Output Summary (Last 24 hours) at 12/04/2019 2008 Last data filed at 12/04/2019 1800 Gross per  24 hour  Intake 720 ml  Output 800 ml  Net -80 ml   Filed Weights   12/02/19 1100 12/02/19 2041  Weight: 68.7 kg 59.7 kg    Examination:   Constitutional: NAD, AAOx3, frail  HEENT: conjunctivae and lids normal, EOMI CV: RRR no M,R,G. Distal pulses +2.  No cyanosis.   RESP: CTA B/L, normal respiratory effort  GI: +BS, tender to palpation, distended Extremities: No effusions, edema, or tenderness in BLE SKIN: warm, dry and  intact Neuro: II - XII grossly intact.  Sensation intact Psych: Normal mood and affect.  Appropriate judgement and reason   Data Reviewed: I have personally reviewed following labs and imaging studies  CBC: Recent Labs  Lab 12/02/19 0627 12/03/19 0528 12/04/19 0613  WBC 10.9* 9.5 10.9*  HGB 14.1 13.0 13.7  HCT 45.7 39.5 41.1  MCV 96.4 90.4 91.1  PLT 264 291 [id number]   Basic Metabolic Panel: Recent Labs  Lab 12/02/19 0627 12/03/19 0528 12/04/19 0613  NA 138 137 138  K 4.3 4.3 4.1  CL 105 101 102  CO2 22 29 28   GLUCOSE 111* 99 118*  BUN 13 16 10   CREATININE 0.73 0.77 0.68  CALCIUM 9.3 9.2 9.0  MG 2.4  --  2.3   GFR: Estimated Creatinine Clearance: 48.5 mL/min (by C-G formula based on SCr of 0.68 mg/dL). Liver Function Tests: No results for input(s): AST, ALT, ALKPHOS, BILITOT, PROT, ALBUMIN in the last 168 hours. No results for input(s): LIPASE, AMYLASE in the last 168 hours. No results for input(s): AMMONIA in the last 168 hours. Coagulation Profile: No results for input(s): INR, PROTIME in the last 168 hours. Cardiac Enzymes: No results for input(s): CKTOTAL, CKMB, CKMBINDEX, TROPONINI in the last 168 hours. BNP (last 3 results) No results for input(s): PROBNP in the last 8760 hours. HbA1C: No results for input(s): HGBA1C in the last 72 hours. CBG: No results for input(s): GLUCAP in the last 168 hours. Lipid Profile: No results for input(s): CHOL, HDL, LDLCALC, TRIG, CHOLHDL, LDLDIRECT in the last 72 hours. Thyroid Function Tests: No results for input(s): TSH, T4TOTAL, FREET4, T3FREE, THYROIDAB in the last 72 hours. Anemia Panel: No results for input(s): VITAMINB12, FOLATE, FERRITIN, TIBC, IRON, RETICCTPCT in the last 72 hours. Sepsis Labs: No results for input(s): PROCALCITON, LATICACIDVEN in the last 168 hours.  Recent Results (from the past 240 hour(s))  SARS CORONAVIRUS 2 (TAT 6-24 HRS) Nasopharyngeal Nasopharyngeal Swab     Status: None   Collection Time:  12/02/19 12:02 PM   Specimen: Nasopharyngeal Swab  Result Value Ref Range Status   SARS Coronavirus 2 NEGATIVE NEGATIVE Final    Comment: (NOTE) SARS-CoV-2 target nucleic acids are NOT DETECTED. The SARS-CoV-2 RNA is generally detectable in upper and lower respiratory specimens during the acute phase of infection. Negative results do not preclude SARS-CoV-2 infection, do not rule out co-infections with other pathogens, and should not be used as the sole basis for treatment or other patient management decisions. Negative results must be combined with clinical observations, patient history, and epidemiological information. The expected result is Negative. Fact Sheet for Patients: SugarRoll.be Fact Sheet for Healthcare Providers: https://www.woods-mathews.com/ This test is not yet approved or cleared by the [place] FDA and  has been authorized for detection and/or diagnosis of SARS-CoV-2 by FDA under an Emergency Use Authorization (EUA). This EUA will remain  in effect (meaning this test can be used) for the duration of the COVID-19 declaration under Section 56 4(b)(1) of the  Act, 21 U.S.C. section 360bbb-3(b)(1), unless the authorization is terminated or revoked sooner. Performed at [facility], [place] [address] [place], [place] [address]       Radiology Studies: No results found.   Scheduled Meds: . calcium-vitamin D  1 tablet Oral Daily  . enoxaparin (LOVENOX) injection  40 mg Subcutaneous Q24H  . levothyroxine  50 mcg Oral Q0600  . polyethylene glycol  17 g Oral BID  . polyethylene glycol  34 g Oral Q2H  . timolol  1 drop Both Eyes BID   Continuous Infusions:   LOS: 0 days     Enzo Bi, MD [facility] If 7PM-7AM, please contact night-coverage 12/04/2019, 8:08 PM

## 2019-12-04 NOTE — Progress Notes (Signed)
Spoke with patients daughter Lenna Sciara and updates and questions answered regarding patients plan of care. 1200 noon patient OOB and up to BR with supervision. Patient given enema and other scheduled bowel prep see MAR. Patient had two large round shape bowel movements. Patients states that pain has improved. Patient also giving bath and undergarments given to patient per request.

## 2019-12-04 NOTE — Progress Notes (Signed)
Went back to check in on patient and patient sleeping.

## 2019-12-04 NOTE — Evaluation (Signed)
Physical Therapy Evaluation Patient Details Name: Teresa Hahn MRN: [id number] DOB: 1932/01/17 Today's Date: 12/04/2019   History of Present Illness  Pt is an 86 y.o. Caucasian female with medical history significant of hypothyroidism, Graves' disease, vitamin D deficiency, vitamin B12 deficiency, viral meningitis, glaucoma, chronic back pain, who presents with constipation and back pain.    Clinical Impression  Pt easily woken, endorsed significant abdominal pain and inability to eat lunch due to pain and nausea. PT and pt spent majority of session discussing PT role, POC, pt medical history, current status, and education on DME and early mobility benefits. The patient reported that she has been up in walking in the room with nursing staff and RW, normally uses a SPC at home. When asked, pt reported feeling weak and dizzy up and moving, did not want to mobilize with PT at the moment. Pt is determined to return home due to husbands health, educated about potential benefit of HHPT and RW at discharge, pt stated she would think about it. MD and RN entered room, any further attempts at mobility held. PT to follow up with patient to encourage mobility during hospital stay and safety of discharge, current recommendation is HHPT with supervision for mobility/OOB pending pt progress.    Follow Up Recommendations Home health PT;Supervision for mobility/OOB    Equipment Recommendations  Rolling walker with 5" wheels    Recommendations for Other Services       Precautions / Restrictions Precautions Precautions: Fall Restrictions Weight Bearing Restrictions: No      Mobility  Bed Mobility               General bed mobility comments: deferred due to pt pain/discomfort  Transfers                    Ambulation/Gait                Stairs            Wheelchair Mobility    Modified Rankin (Stroke Patients Only)       Balance                                              Pertinent Vitals/Pain Pain Assessment: Faces Faces Pain Scale: Hurts little more Pain Location: abdomen Pain Intervention(s): Limited activity within patient's tolerance;Monitored during session    Home Living Family/patient expects to be discharged to:: Private residence Living Arrangements: Spouse/significant other;Children Available Help at Discharge: Family Type of Home: House         Home Equipment: Kasandra Knudsen - single point Additional Comments: 1 fall about 11 months ago    Prior Function Level of Independence: Independent with assistive device(s)         Comments: uses grocery cart at store, utilizes cane at baseline for household mobility. Provideds cooking/cleaning for family. caregiver of husband     Hand Dominance        Extremity/Trunk Assessment                Communication   Communication: No difficulties  Cognition Arousal/Alertness: Awake/alert Behavior During Therapy: WFL for tasks assessed/performed Overall Cognitive Status: Within Functional Limits for tasks assessed  General Comments: pt very verbose during session      General Comments      Exercises Other Exercises Other Exercises: PT and pt spent extensive time discussing PT role, POC, pt history and current status.   Assessment/Plan    PT Assessment Patient needs continued PT services  PT Problem List Decreased strength;Decreased mobility;Decreased activity tolerance;Pain;Decreased knowledge of use of DME       PT Treatment Interventions DME instruction;Therapeutic exercise;Gait training;Balance training;Stair training;Neuromuscular re-education;Functional mobility training;Therapeutic activities;Patient/family education    PT Goals (Current goals can be found in the Care Plan section)  Acute Rehab PT Goals Patient Stated Goal: to feel better PT Goal Formulation: With patient Time For Goal Achievement:  12/18/19 Potential to Achieve Goals: Good    Frequency Min 2X/week   Barriers to discharge        Co-evaluation               AM-PAC PT "6 Clicks" Mobility  Outcome Measure Help needed turning from your back to your side while in a flat bed without using bedrails?: A Little Help needed moving from lying on your back to sitting on the side of a flat bed without using bedrails?: A Little Help needed moving to and from a bed to a chair (including a wheelchair)?: A Little Help needed standing up from a chair using your arms (e.g., wheelchair or bedside chair)?: A Little Help needed to walk in hospital room?: A Little Help needed climbing 3-5 steps with a railing? : A Lot 6 Click Score: 17    End of Session   Activity Tolerance: Patient limited by pain Patient left: in bed;with call bell/phone within reach;with nursing/sitter in room(MD in room as well) Nurse Communication: Other (comment)(abdominal pain) PT Visit Diagnosis: Other abnormalities of gait and mobility (R26.89);Pain Pain - Right/Left: (midline) Pain - part of body: (lumbar/thoracic spine)    Time: [id number] PT Time Calculation (min) (ACUTE ONLY): 20 min   Charges:   PT Evaluation $PT Eval Low Complexity: 1 Low         Lieutenant Diego PT, DPT 2:16 PM,12/04/19

## 2019-12-04 NOTE — Telephone Encounter (Signed)
Called and LM for Teresa Hahn indicating that Dr. Georgina Snell does not need a bone biopsy in addition to the kyphoplasty/vertebroplasty.

## 2019-12-04 NOTE — Progress Notes (Addendum)
Patient states she is having pain. Patient refuses PRN medication. This RN also attempted to repositioned patient and patient declined. Patient had medium BM. Education giving to patient on mobility and use of BSC. Patient refused to get OOB. Dr.LaI notified and new orders placed for PT consult.

## 2019-12-04 NOTE — Telephone Encounter (Signed)
Caryl Pina from Dr Estanislado Pandy office called asking if Dr Georgina Snell would also like a bone biopsy to be completed when she is there for the Acromioplasty?  Caryl Pina can be reached at [phone]

## 2019-12-05 ENCOUNTER — Other Ambulatory Visit: Payer: Self-pay | Admitting: Radiology

## 2019-12-05 LAB — BASIC METABOLIC PANEL
Anion gap: 4 — ABNORMAL LOW (ref 5–15)
BUN: 10 mg/dL (ref 8–23)
CO2: 33 mmol/L — ABNORMAL HIGH (ref 22–32)
Calcium: 9.2 mg/dL (ref 8.9–10.3)
Chloride: 99 mmol/L (ref 98–111)
Creatinine, Ser: 0.87 mg/dL (ref 0.44–1.00)
GFR calc Af Amer: >60 mL/min (ref >60)
GFR calc non Af Amer: >60 mL/min (ref >60)
Glucose, Bld: 99 mg/dL (ref 70–99)
Potassium: 3.6 mmol/L (ref 3.5–5.1)
Sodium: 136 mmol/L (ref 135–145)

## 2019-12-05 LAB — CBC
HCT: 39.9 % (ref 36.0–46.0)
Hemoglobin: 13.1 g/dL (ref 12.0–15.0)
MCH: 30 pg (ref 26.0–34.0)
MCHC: 32.8 g/dL (ref 30.0–36.0)
MCV: 91.5 fL (ref 80.0–100.0)
Platelets: 309 10*3/uL (ref 150–400)
RBC: 4.36 MIL/uL (ref 3.87–5.11)
RDW: 13.4 % (ref 11.5–15.5)
WBC: 9.5 10*3/uL (ref 4.0–10.5)
nRBC: 0 % (ref 0.0–0.2)

## 2019-12-05 LAB — MAGNESIUM: Magnesium: 2.2 mg/dL (ref 1.7–2.4)

## 2019-12-05 MED ORDER — POLYETHYLENE GLYCOL 3350 17 GM/SCOOP PO POWD: 17.0000 g | Freq: Two times a day (BID) | ORAL | 0 refills | Status: AC

## 2019-12-05 NOTE — Progress Notes (Signed)
Called patients daughter to let he know her mom is discharged and she can pick her up, she said is not off work unitl 4:00 pm and is 1 hr-1 1/2 hours away so the earliest she could pick her up is 5:00 pm

## 2019-12-05 NOTE — Progress Notes (Signed)
Reviewed home meds, discharge instructions and follow up appointments and prescriptions with patient and patient verbalized understanding.

## 2019-12-05 NOTE — Discharge Summary (Signed)
Physician Discharge Summary   Teresa Hahn  female DOB: 06/19/1932  [id number]  PCP: Inda Coke, PA  Admit date: 12/02/2019 Discharge date:   Admitted From: Home Disposition:  home Home Health: Yes CODE STATUS: DNR  Discharge Instructions    Diet - low sodium heart healthy   Complete by: As directed    Discharge instructions   Complete by: As directed    You presented with very severe constipation and stool impaction.  We have cleaned you out with a lot of Miralax and enema.  Please continue to take Miralax with a glass of fluid twice a day to keep your stool soft and to prevent constipation.    Dr. Enzo Bi - -   Increase activity slowly   Complete by: As directed        Hospital Course:  For full details, please see H&P, progress notes, consult notes and ancillary notes.  Briefly,  Teresa Hahn a 86 y.o.Caucasian femalewith medical history significant ofhypothyroidism, Graves' disease, vitamin D deficiency, vitamin B12 deficiency, viral meningitis, glaucoma, chronic back pain, who presented with constipation and back pain.   Abdominal pain and poor appetitie 2/2 severe Constipation and stool impaction Pt presented with severe constipation filling her entire colon, and likely stool impaction as well, which was causing stabbing pain and cramping when the bowels were trying to move.  On exam, pt's abdomen was distended and firm and tender to palpation.  Due to severe constipation, pt has had poor PO intake, though didn't have signs of dehydration, so IVF wasn't ordered to avoid volume overloading an elderly patient.  Didn't order opioids pain meds for pt's abdominal pain in order to avoid exacerbating constipation.  Due to amount of stool and the extended length of time the old stool had been present, CT scan was starting to show focal area of colonic wall thickening in the mid sigmoid with pericolonic edema may represent focal colitis, including stercoral  disease, but ptdoes not have fever and leukocytosis was mild with WBC 10.9, so abx was not initiated. But it's expected that if pt's stool impaction wasn't relieved, she would develop frank colitis and possible bowel perforation.  Pt is 86 y.o., frail and weak and would not be able to relieve this degree of stool impaction on her own at home, so is kept in the hospital so we could help clean out her bowels.    Pt was givensenna, magnesium citrate, high-dose Miralax 34g q2h for 5 doses per day for 2 days, followed by an enema, which finally started the bowel moving after 2 full days of working on bowel regimen, and pt had 10x BM's charted in 1 day.  Pt needed a lot of nursing assistance throughout this arduous process.  Pt reported feeling "much better" after her bowel was cleaned out.  Pt was discharged on scheduled Miralax with a glass of fluid twice a day to keep stool soft and to prevent constipation.  Hypothyroidism:  Continued Synthroid  Lower back pain:this is a chronic issue.  Patient states that she has chronic lower back pain due to compression fracture of the lower back that was caused by a fall. She is currently seeing a spine specialist and is undergoing consideration for kyphoplasty. She has urinary incontinence sometimes. She also feels tingling and decreased sensation in her big toes bilaterally sometimes.    The lower back pain was exacerbated by the constipation since abdominal fullness was exerting pressure on her lower back.  Pt's back pain  improved after relief of her constipation.   Discharge Diagnoses:  Principal Problem:   Constipation Active Problems:   Hypothyroidism   Lower back pain   Impacted stool in intestine [facility])    Discharge Instructions:  Allergies as of 12/05/2019      Reactions   Cyclobenzaprine Other (See Comments)   Mental lethargy      Medication List    TAKE these medications   Betimol 0.5 % ophthalmic solution Generic drug:  timolol Place 1 drop into both eyes 2 (two) times daily.   calcium-vitamin D 500-200 MG-UNIT tablet Commonly known as: OSCAL WITH D Take 1 tablet by mouth daily.   levothyroxine 50 MCG tablet Commonly known as: SYNTHROID Take 1 tablet (50 mcg total) by mouth daily.   polyethylene glycol powder 17 GM/SCOOP powder Commonly known as: GLYCOLAX/MIRALAX Take 17 g by mouth 2 (two) times daily.       Follow-up Information    Inda Coke, [place]. Schedule an appointment as soon as possible for a visit in 1 week(s).   Specialty: Physician Assistant Contact information: [place] [address] [phone]           Allergies  Allergen Reactions  . Cyclobenzaprine Other (See Comments)    Mental lethargy     The results of significant diagnostics from this hospitalization (including imaging, microbiology, ancillary and laboratory) are listed below for reference.   Consultations:   Procedures/Studies: CT THORACIC SPINE WO CONTRAST  Result Date: 11/18/2019 CLINICAL DATA:  Mid back pain with suspected compression fracture. EXAM: CT THORACIC SPINE WITHOUT CONTRAST TECHNIQUE: Multidetector CT images of the thoracic were obtained using the standard protocol without intravenous contrast. COMPARISON:  10/30/2019 radiography FINDINGS: Alignment: Mild exaggerated thoracic kyphosis. No traumatic malalignment or listhesis. Vertebrae: Inferior endplate compression fracture at T12 with up to 30% height loss. No evidence of underlying bone lesion. The fracture has sclerotic margins and was seen on comparison study. There has been a remote T1 superior endplate fracture with mild depression, chronic appearing Paraspinal and other soft tissues: Adenoma appearance in the right adrenal gland. No inflammation seen around the compression fracture. Aortic and coronary atherosclerosis. Disc levels: Spondylosis causes bridging osteophytes from T2 to T7. Negative facets. IMPRESSION: 1.  Subacute to chronic appearing compression fracture at T12 with inferior endplate depression causing 30% height loss. No retropulsion. 2. Remote T1 compression fracture with mild height loss. 3. Spondylosis with multi-level bridging osteophyte. Electronically Signed   By: Monte Fantasia M.D.   On: 11/18/2019 10:53   CT Renal Stone Study  Result Date: 12/02/2019 CLINICAL DATA:  Flank pain and constipation. Patient reports no bowel movement for 2 weeks and is incontinent. EXAM: CT ABDOMEN AND PELVIS WITHOUT CONTRAST TECHNIQUE: Multidetector CT imaging of the abdomen and pelvis was performed following the standard protocol without IV contrast. COMPARISON:  None. FINDINGS: Lower chest: Subsegmental atelectasis in the right lower lobe. No pleural fluid. Small hiatal hernia. Normal heart size. Hepatobiliary: Multiple noncalcified gallstones in the gallbladder. No pericholecystic fluid. No evidence of focal lesion on noncontrast exam. Pancreas: Parenchymal atrophy. No ductal dilatation or inflammation. Spleen: Normal in size without focal abnormality. Adrenals/Urinary Tract: 2.4 cm right adrenal adenoma. Mild left adrenal thickening without dominant lesion. Right kidney slightly malrotated directed anteriorly. Mild right renal atrophy. No hydronephrosis or perinephric edema. No renal stones. Urinary bladder is partially distended. Bladder is trabeculated with right lateral bladder diverticulum. No bladder wall thickening. Stomach/Bowel: Small hiatal hernia. Stomach is nondistended. Duodenal diverticulum without  inflammation. No small bowel obstruction or abnormal distention. Normal appendix. Large colonic stool burden. Large amount of stool throughout the entire colon. Sigmoid colon is redundant. Mild wall thickening with pericolonic edema in the mid sigmoid colon series 2, image 52. Small volume of stool in the rectum. Vascular/Lymphatic: Aortic atherosclerosis and tortuosity. No aortic aneurysm. No bulky  abdominopelvic adenopathy, evaluation limited in the absence of contrast. Reproductive: Uterus and bilateral adnexa are unremarkable. Other: No free air, free fluid, or intra-abdominal fluid collection. Musculoskeletal: T12 compression fracture which is recently assessed on thoracic spine CT 11/18/2019, unchanged no new osseous abnormalities. Mild scoliosis and degenerative change in the lumbar spine. Prior L3-L5 laminectomy on the left. IMPRESSION: 1. Large colonic stool burden throughout the colon consistent with constipation. Focal area of colonic wall thickening in the mid sigmoid with pericolonic edema may represent focal colitis, including stercoral disease. 2. No hydronephrosis or obstructive uropathy. Urinary bladder is mildly trabeculated with bladder diverticulum on the right. 3. Incidental findings of cholelithiasis, right adrenal adenoma, and small hiatal hernia. Aortic Atherosclerosis (ICD10-I70.0). Electronically Signed   By: Keith Rake M.D.   On: 12/02/2019 06:21      Labs: BNP (last 3 results) No results for input(s): BNP in the last 8760 hours. Basic Metabolic Panel: Recent Labs  Lab 12/02/19 0627 12/03/19 0528 12/04/19 0613 12/05/19 0505  NA 138 137 138 136  K 4.3 4.3 4.1 3.6  CL 105 101 102 99  CO2 22 29 28  33*  GLUCOSE 111* 99 118* 99  BUN 13 16 10 10   CREATININE 0.73 0.77 0.68 0.87  CALCIUM 9.3 9.2 9.0 9.2  MG 2.4  --  2.3 2.2   Liver Function Tests: No results for input(s): AST, ALT, ALKPHOS, BILITOT, PROT, ALBUMIN in the last 168 hours. No results for input(s): LIPASE, AMYLASE in the last 168 hours. No results for input(s): AMMONIA in the last 168 hours. CBC: Recent Labs  Lab 12/02/19 0627 12/03/19 0528 12/04/19 0613 12/05/19 0505  WBC 10.9* 9.5 10.9* 9.5  HGB 14.1 13.0 13.7 13.1  HCT 45.7 39.5 41.1 39.9  MCV 96.4 90.4 91.1 91.5  PLT 264 291 286 309   Cardiac Enzymes: No results for input(s): CKTOTAL, CKMB, CKMBINDEX, TROPONINI in the last 168  hours. BNP: Invalid input(s): POCBNP CBG: No results for input(s): GLUCAP in the last 168 hours. D-Dimer No results for input(s): DDIMER in the last 72 hours. Hgb A1c No results for input(s): HGBA1C in the last 72 hours. Lipid Profile No results for input(s): CHOL, HDL, LDLCALC, TRIG, CHOLHDL, LDLDIRECT in the last 72 hours. Thyroid function studies No results for input(s): TSH, T4TOTAL, T3FREE, THYROIDAB in the last 72 hours.  Invalid input(s): FREET3 Anemia work up No results for input(s): VITAMINB12, FOLATE, FERRITIN, TIBC, IRON, RETICCTPCT in the last 72 hours. Urinalysis No results found for: COLORURINE, APPEARANCEUR, [place], [place], [place], [place], [place], [place], PROTEINUR, UROBILINOGEN, NITRITE, LEUKOCYTESUR Sepsis Labs Invalid input(s): PROCALCITONIN,  WBC,  LACTICIDVEN Microbiology Recent Results (from the past 240 hour(s))  SARS CORONAVIRUS 2 (TAT 6-24 HRS) Nasopharyngeal Nasopharyngeal Swab     Status: None   Collection Time: 12/02/19 12:02 PM   Specimen: Nasopharyngeal Swab  Result Value Ref Range Status   SARS Coronavirus 2 NEGATIVE NEGATIVE Final    Comment: (NOTE) SARS-CoV-2 target nucleic acids are NOT DETECTED. The SARS-CoV-2 RNA is generally detectable in upper and lower respiratory specimens during the acute phase of infection. Negative results do not preclude SARS-CoV-2 infection, do not rule out co-infections  with other pathogens, and should not be used as the sole basis for treatment or other patient management decisions. Negative results must be combined with clinical observations, patient history, and epidemiological information. The expected result is Negative. Fact Sheet for Patients: SugarRoll.be Fact Sheet for Healthcare Providers: https://www.woods-mathews.com/ This test is not yet approved or cleared by the [place] FDA and  has been authorized for detection and/or diagnosis of SARS-CoV-2  by FDA under an Emergency Use Authorization (EUA). This EUA will remain  in effect (meaning this test can be used) for the duration of the COVID-19 declaration under Section 56 4(b)(1) of the Act, 21 U.S.C. section 360bbb-3(b)(1), unless the authorization is terminated or revoked sooner. Performed at [facility], [place] [address] [place], [place] [address]      Total time spend on discharging this patient, including the last patient exam, discussing the hospital stay, instructions for ongoing care as it relates to all pertinent caregivers, as well as preparing the medical discharge records, prescriptions, and/or referrals as applicable, is 45 minutes.    Enzo Bi, MD  [facility] 12/05/2019, 10:02 AM  If 7PM-7AM, please contact night-coverage

## 2019-12-05 NOTE — Progress Notes (Signed)
Pt has had several bowel movements. Stated, "she is feeling much better."

## 2019-12-05 NOTE — Progress Notes (Signed)
PT Cancellation Note  Patient Details Name: Teresa Hahn MRN: [id number] DOB: [birth year]   Cancelled Treatment:    Reason Eval/Treat Not Completed: Other (comment)(Pt sitting up in bed, eating breakfast, politely declined PT. PT to follow up as able.)   Lieutenant Diego PT, DPT 8:54 AM,12/05/19

## 2019-12-10 ENCOUNTER — Other Ambulatory Visit: Payer: Self-pay

## 2019-12-10 ENCOUNTER — Ambulatory Visit (HOSPITAL_COMMUNITY)
Admission: RE | Admit: 2019-12-10 | Discharge: 2019-12-10 | Disposition: A | Discharge status: Home / Self Care | Payer: Medicare PPO | Source: Ambulatory Visit | Attending: Interventional Radiology | Admitting: Interventional Radiology

## 2019-12-10 DIAGNOSIS — H269 Unspecified cataract: Secondary | ICD-10-CM | POA: Insufficient documentation

## 2019-12-10 DIAGNOSIS — D3501 Benign neoplasm of right adrenal gland: Secondary | ICD-10-CM | POA: Insufficient documentation

## 2019-12-10 DIAGNOSIS — Z888 Allergy status to other drugs, medicaments and biological substances status: Secondary | ICD-10-CM | POA: Insufficient documentation

## 2019-12-10 DIAGNOSIS — E559 Vitamin D deficiency, unspecified: Secondary | ICD-10-CM | POA: Diagnosis not present

## 2019-12-10 DIAGNOSIS — Z79890 Hormone replacement therapy (postmenopausal): Secondary | ICD-10-CM | POA: Insufficient documentation

## 2019-12-10 DIAGNOSIS — S22080A Wedge compression fracture of T11-T12 vertebra, initial encounter for closed fracture: Secondary | ICD-10-CM | POA: Diagnosis present

## 2019-12-10 DIAGNOSIS — E0500 Thyrotoxicosis with diffuse goiter without thyrotoxic crisis or storm: Secondary | ICD-10-CM | POA: Insufficient documentation

## 2019-12-10 DIAGNOSIS — H409 Unspecified glaucoma: Secondary | ICD-10-CM | POA: Insufficient documentation

## 2019-12-10 HISTORY — PX: IR KYPHO THORACIC WITH BONE BIOPSY: IMG5518

## 2019-12-10 LAB — URINALYSIS, COMPLETE (UACMP) WITH MICROSCOPIC
Bacteria, UA: NONE SEEN
Bilirubin Urine: NEGATIVE
Glucose, UA: NEGATIVE mg/dL
Ketones, ur: NEGATIVE mg/dL
Leukocytes,Ua: NEGATIVE
Nitrite: NEGATIVE
Protein, ur: NEGATIVE mg/dL
Specific Gravity, Urine: 1.011 (ref 1.005–1.030)
pH: 7 (ref 5.0–8.0)

## 2019-12-10 LAB — CBC
HCT: 41.3 % (ref 36.0–46.0)
Hemoglobin: 13.2 g/dL (ref 12.0–15.0)
MCH: 30 pg (ref 26.0–34.0)
MCHC: 32 g/dL (ref 30.0–36.0)
MCV: 93.9 fL (ref 80.0–100.0)
Platelets: 374 10*3/uL (ref 150–400)
RBC: 4.4 MIL/uL (ref 3.87–5.11)
RDW: 13.2 % (ref 11.5–15.5)
WBC: 6.4 10*3/uL (ref 4.0–10.5)
nRBC: 0 % (ref 0.0–0.2)

## 2019-12-10 LAB — PROTIME-INR
INR: 1 (ref 0.8–1.2)
Prothrombin Time: 13 seconds (ref 11.4–15.2)

## 2019-12-10 LAB — BASIC METABOLIC PANEL
Anion gap: 11 (ref 5–15)
BUN: 9 mg/dL (ref 8–23)
CO2: 27 mmol/L (ref 22–32)
Calcium: 9.3 mg/dL (ref 8.9–10.3)
Chloride: 101 mmol/L (ref 98–111)
Creatinine, Ser: 0.85 mg/dL (ref 0.44–1.00)
GFR calc Af Amer: >60 mL/min (ref >60)
GFR calc non Af Amer: >60 mL/min (ref >60)
Glucose, Bld: 97 mg/dL (ref 70–99)
Potassium: 4.3 mmol/L (ref 3.5–5.1)
Sodium: 139 mmol/L (ref 135–145)

## 2019-12-10 MED ORDER — ACETAMINOPHEN 325 MG PO TABS
650.0000 mg | ORAL_TABLET | Freq: Four times a day (QID) | ORAL | Status: DC | PRN
  Filled 2019-12-10: qty 2

## 2019-12-10 MED ORDER — MIDAZOLAM HCL 2 MG/2ML IJ SOLN
INTRAMUSCULAR | Status: AC
  Filled 2019-12-10: qty 2

## 2019-12-10 MED ORDER — CEFAZOLIN SODIUM-DEXTROSE 2-4 GM/100ML-% IV SOLN: 2.0000 g | INTRAVENOUS | Status: AC

## 2019-12-10 MED ORDER — ACETAMINOPHEN 325 MG PO TABS
ORAL_TABLET | ORAL | Status: AC
  Administered 2019-12-10: 650 mg via ORAL
  Filled 2019-12-10: qty 2

## 2019-12-10 MED ORDER — FENTANYL CITRATE (PF) 100 MCG/2ML IJ SOLN
INTRAMUSCULAR | Status: AC
  Filled 2019-12-10: qty 2

## 2019-12-10 MED ORDER — SODIUM CHLORIDE 0.9 % IV SOLN: INTRAVENOUS | Status: DC

## 2019-12-10 MED ORDER — MIDAZOLAM HCL 2 MG/2ML IJ SOLN
INTRAMUSCULAR | Status: AC | PRN
  Administered 2019-12-10: 0.5 mg via INTRAVENOUS
  Administered 2019-12-10: 1 mg via INTRAVENOUS

## 2019-12-10 MED ORDER — GELATIN ABSORBABLE 12-7 MM EX MISC
CUTANEOUS | Status: AC
  Filled 2019-12-10: qty 1

## 2019-12-10 MED ORDER — TOBRAMYCIN SULFATE 1.2 G IJ SOLR
INTRAMUSCULAR | Status: AC
  Filled 2019-12-10: qty 1.2

## 2019-12-10 MED ORDER — CEFAZOLIN SODIUM-DEXTROSE 2-4 GM/100ML-% IV SOLN
INTRAVENOUS | Status: AC
  Administered 2019-12-10: 2 g via INTRAVENOUS
  Filled 2019-12-10: qty 100

## 2019-12-10 MED ORDER — FENTANYL CITRATE (PF) 100 MCG/2ML IJ SOLN
INTRAMUSCULAR | Status: AC | PRN
  Administered 2019-12-10: 12.5 ug via INTRAVENOUS
  Administered 2019-12-10: 25 ug via INTRAVENOUS

## 2019-12-10 MED ORDER — BUPIVACAINE HCL (PF) 0.5 % IJ SOLN
INTRAMUSCULAR | Status: AC
  Filled 2019-12-10: qty 30

## 2019-12-10 MED ORDER — SODIUM CHLORIDE 0.9 % IV SOLN: INTRAVENOUS | Status: AC

## 2019-12-10 NOTE — Progress Notes (Signed)
Discharge instructions reviewed with patient and family.  

## 2019-12-10 NOTE — Procedures (Signed)
S/P T 12 balloon KP S.Alois Mincer MD

## 2019-12-10 NOTE — Discharge Instructions (Signed)
1. No stooping,bending or lifting more than 10 Lbs for 2 weeks. 2.Use walker to ambulate for  2 weeks. 3.No driving for 2 weeks  4. See PRN 2 weeks  Balloon Kyphoplasty, Care After This sheet gives you information about how to care for yourself after your procedure. Your health care provider may also give you more specific instructions. If you have problems or questions, contact your health care provider. What can I expect after the procedure? After your procedure, it is common to have back pain. Follow these instructions at home: Medicines  Take over-the-counter and prescription medicines only as told by your health care provider.  Ask your health care provider if the medicine prescribed to you: ? Requires you to avoid driving or using heavy machinery. ? Can cause constipation. You may need to take steps to prevent or treat constipation, such as:  Drink enough fluid to keep your urine pale yellow.  Take over-the-counter or prescription medicines.  Eat foods that are high in fiber, such as beans, whole grains, and fresh fruits and vegetables.  Limit foods that are high in fat and processed sugars, such as fried or sweet foods. Puncture site care   Follow instructions from your health care provider about how to take care of your puncture site. Make sure you: ? Wash your hands with soap and water before and after you change your bandage (dressing). If soap and water are not available, use hand sanitizer. ? Change your dressing as told by your health care provider. ? Leave skin glue or adhesive strips in place. These skin closures may need to be in place for 2 weeks or longer. If adhesive strip edges start to loosen and curl up, you may trim the loose edges. Do not remove adhesive strips completely unless your health care provider tells you to do that.  Check your puncture site every day for signs of infection. Watch for: ? Redness, swelling, or pain. ? Fluid or  blood. ? Warmth. ? Pus or a bad smell.  Keep your dressing dry until your health care provider says that it can be removed. Managing pain, stiffness, and swelling   If directed, put ice on the painful area. ? Put ice in a plastic bag. ? Place a towel between your skin and the bag. ? Leave the ice on for 20 minutes, 2-3 times a day. Activity  Rest your back and avoid intense physical activity for as long as told by your health care provider.  Avoid bending, lifting, or twisting your back for as long as told by your health care provider.  Return to your normal activities as told by your health care provider. Ask your health care provider what activities are safe for you.  Do not lift anything that is heavier than 5 lb (2.2 kg). You may need to avoid heavy lifting for several weeks. General instructions  Do not use any products that contain nicotine or tobacco, such as cigarettes, e-cigarettes, and chewing tobacco. These can delay bone healing. If you need help quitting, ask your health care provider.  Do not drive for 24 hours if you were given a sedative during your procedure.  Keep all follow-up visits as told by your health care provider. This is important. Contact a health care provider if:  You have a fever or chills.  You have redness, swelling, or pain at the site of your puncture.  You have fluid, blood, or pus coming from the puncture site.  You have pain that  gets worse or does not get better with medicine.  You develop numbness or weakness in any part of your body. Get help right away if:  You have chest pain.  You have difficulty breathing.  You have weakness, numbness, or tingling in your legs.  You cannot control your bladder or bowel movements.  You suddenly become weak or numb on one side of your body.  You become very confused.  You have trouble speaking or understanding, or both. Summary  Follow instructions from your health care provider about  how to take care of your puncture site.  Take over-the-counter and prescription medicines only as told by your health care provider.  Rest your back and avoid intense physical activity for as long as told by your health care provider.  Contact a health care provider if you have pain that gets worse or does not get better with medicine.  Keep all follow-up visits as told by your health care provider. This is important. This information is not intended to replace advice given to you by your health care provider. Make sure you discuss any questions you have with your health care provider. Document Revised: 09/10/2018 Document Reviewed: 09/10/2018 Elsevier Patient Education  [place]. 1. No stooping,bending or lifting more than 10 Lbs for 2 weeks. 2.Use walker to ambulate for  2 weeks. 3.No driving for 2 weeks  4. See PRN 2 weeks

## 2019-12-10 NOTE — H&P (Signed)
Chief Complaint: Teresa Hahn was seen in consultation today for T12 kyphoplasty/vertebroplasty  Referring Physician(s): Lynne Leader  Supervising Physician: Luanne Bras  Teresa Hahn Status: [facility] - Out-pt  History of Present Illness: Teresa Hahn is a 86 y.o. female with a past medical history significant for cataract, glaucoma, hypothyroidism, Graves disease and recent T12 compression fracture who presents today for a T12 kyphoplasty/vertebroplasty. Teresa Hahn was seen by sports medicine (Dr. Georgina Snell) on 11/04/19 due to mid to low back pain for approximately 2 weeks. The pain started when Teresa Hahn heard a crack in her back lifting her dog into bed and had worsened ever since. Teresa Hahn underwent a CT thoracic spine w/o contrast on 11/18/19 which noted subacute to chronic appearing compression fracture at T12 with inferior endplate depression causing 30% height loss, no retropulsion and remote T1 compression fracture with mild height loss. Teresa Hahn continued to have worsening back pain which was not helped by conservative management and was referred to [facility] for intervention.  Teresa Hahn states that her back pain is very severe and causes her difficulty walking due to pain in her legs and back. Teresa Hahn has been using a cane at home for ambulation but is unsteady on her feet. Teresa Hahn reports severe constipation with previous impaction and has not had a bowel movement in several days. Teresa Hahn also reports urinary incontinence that was present prior to this event but was related to a previous fall - Teresa Hahn states Teresa Hahn was told it would get better but it has not gotten any better which is upsetting to her. Teresa Hahn denies any numbness or tingling. Teresa Hahn states understanding of the requested procedure and wishes to proceed.  Past Medical History:  Diagnosis Date  . Cataract   . Glaucoma   . Graves disease   . Hypothyroidism   . Meningitis    Viral  . Shingles    has had 4 times  . Vitamin B12 deficiency   . Vitamin D deficiency      Past Surgical History:  Procedure Laterality Date  . CATARACT EXTRACTION, BILATERAL    . SPINAL CORD DECOMPRESSION     L3-5  . THYROID SURGERY      Allergies: Cyclobenzaprine  Medications: Prior to Admission medications   Medication Sig Start Date End Date Taking? Authorizing Provider  calcium-vitamin D (OSCAL WITH D) 500-200 MG-UNIT tablet Take 1 tablet by mouth daily.   Yes [provider]  levothyroxine (SYNTHROID) 50 MCG tablet Take 1 tablet (50 mcg total) by mouth daily. 10/31/19  Yes Worley, Aldona Bar, PA  polyethylene glycol powder (GLYCOLAX/MIRALAX) 17 GM/SCOOP powder Take 17 g by mouth 2 (two) times daily. 12/05/19  Yes Enzo Bi, MD  timolol (BETIMOL) 0.5 % ophthalmic solution Place 1 drop into both eyes 2 (two) times daily. 10/07/19  Yes Inda Coke, PA     Family History  Problem Relation Age of Onset  . Alcohol abuse Mother   . Alcohol abuse Father     Social History   Socioeconomic History  . Marital status: Married    Spouse name: Not on file  . Number of children: Not on file  . Years of education: Not on file  . Highest education level: Not on file  Occupational History  . Not on file  Tobacco Use  . Smoking status: Never Smoker  . Smokeless tobacco: Never Used  Substance and Sexual Activity  . Alcohol use: Never  . Drug use: Never  . Sexual activity: Not Currently  Other Topics Concern  . Not  on file  Social History Narrative   Married   Social Determinants of Health   Financial Resource Strain:   . Difficulty of Paying Living Expenses: Not on file  Food Insecurity:   . Worried About Charity fundraiser in the Last Year: Not on file  . Ran Out of Food in the Last Year: Not on file  Transportation Needs:   . Lack of Transportation (Medical): Not on file  . Lack of Transportation (Non-Medical): Not on file  Physical Activity:   . Days of Exercise per Week: Not on file  . Minutes of Exercise per Session: Not on file  Stress:    . Feeling of Stress : Not on file  Social Connections:   . Frequency of Communication with Friends and Family: Not on file  . Frequency of Social Gatherings with Friends and Family: Not on file  . Attends Religious Services: Not on file  . Active Member of Clubs or Organizations: Not on file  . Attends Archivist Meetings: Not on file  . Marital Status: Not on file     Review of Systems: A 12 point ROS discussed and pertinent positives are indicated in the HPI above.  All other systems are negative.  Review of Systems  Constitutional: Negative for chills and fever.  Respiratory: Negative for cough and shortness of breath.   Cardiovascular: Negative for chest pain.  Gastrointestinal: Positive for constipation. Negative for abdominal pain, nausea and vomiting.  Genitourinary:       (+) incontinent  Musculoskeletal: Positive for back pain and gait problem.  Skin: Negative for wound.  Neurological: Negative for dizziness and headaches.    Vital Signs: BP (!) 163/64   Pulse 67   Temp 97.9 F (36.6 C) (Skin)   Resp 15   Ht 5\' 7"  (1.702 m)   Wt 132 lb (59.9 kg)   SpO2 96%   BMI 20.67 kg/m   Physical Exam Vitals reviewed.  Constitutional:      General: Teresa Hahn is not in acute distress.    Appearance: Teresa Hahn is ill-appearing.     Comments: Teresa Hahn laying in left lateral decubitus position on bed 2/2 pain.   HENT:     Head: Normocephalic.     Mouth/Throat:     Mouth: Mucous membranes are moist.     Pharynx: Oropharynx is clear. No oropharyngeal exudate or posterior oropharyngeal erythema.  Cardiovascular:     Rate and Rhythm: Normal rate and regular rhythm.  Pulmonary:     Effort: Pulmonary effort is normal.     Breath sounds: Normal breath sounds.  Abdominal:     General: There is no distension.     Palpations: Abdomen is soft.  Skin:    General: Skin is warm and dry.  Neurological:     General: No focal deficit present.     Mental Status: Teresa Hahn is alert and  oriented to person, place, and time.  Psychiatric:        Mood and Affect: Mood normal.        Behavior: Behavior normal.        Thought Content: Thought content normal.        Judgment: Judgment normal.      MD Evaluation Airway: WNL Heart: WNL Abdomen: WNL Chest/ Lungs: WNL ASA  Classification: 3 Mallampati/Airway Score: Two   Imaging: CT THORACIC SPINE WO CONTRAST  Result Date: 11/18/2019 CLINICAL DATA:  Mid back pain with suspected compression fracture. EXAM: CT THORACIC SPINE WITHOUT  CONTRAST TECHNIQUE: Multidetector CT images of the thoracic were obtained using the standard protocol without intravenous contrast. COMPARISON:  10/30/2019 radiography FINDINGS: Alignment: Mild exaggerated thoracic kyphosis. No traumatic malalignment or listhesis. Vertebrae: Inferior endplate compression fracture at T12 with up to 30% height loss. No evidence of underlying bone lesion. The fracture has sclerotic margins and was seen on comparison study. There has been a remote T1 superior endplate fracture with mild depression, chronic appearing Paraspinal and other soft tissues: Adenoma appearance in the right adrenal gland. No inflammation seen around the compression fracture. Aortic and coronary atherosclerosis. Disc levels: Spondylosis causes bridging osteophytes from T2 to T7. Negative facets. IMPRESSION: 1. Subacute to chronic appearing compression fracture at T12 with inferior endplate depression causing 30% height loss. No retropulsion. 2. Remote T1 compression fracture with mild height loss. 3. Spondylosis with multi-level bridging osteophyte. Electronically Signed   By: Monte Fantasia M.D.   On: 11/18/2019 10:53   CT Renal Stone Study  Result Date: 12/02/2019 CLINICAL DATA:  Flank pain and constipation. Teresa Hahn reports no bowel movement for 2 weeks and is incontinent. EXAM: CT ABDOMEN AND PELVIS WITHOUT CONTRAST TECHNIQUE: Multidetector CT imaging of the abdomen and pelvis was performed following  the standard protocol without IV contrast. COMPARISON:  None. FINDINGS: Lower chest: Subsegmental atelectasis in the right lower lobe. No pleural fluid. Small hiatal hernia. Normal heart size. Hepatobiliary: Multiple noncalcified gallstones in the gallbladder. No pericholecystic fluid. No evidence of focal lesion on noncontrast exam. Pancreas: Parenchymal atrophy. No ductal dilatation or inflammation. Spleen: Normal in size without focal abnormality. Adrenals/Urinary Tract: 2.4 cm right adrenal adenoma. Mild left adrenal thickening without dominant lesion. Right kidney slightly malrotated directed anteriorly. Mild right renal atrophy. No hydronephrosis or perinephric edema. No renal stones. Urinary bladder is partially distended. Bladder is trabeculated with right lateral bladder diverticulum. No bladder wall thickening. Stomach/Bowel: Small hiatal hernia. Stomach is nondistended. Duodenal diverticulum without inflammation. No small bowel obstruction or abnormal distention. Normal appendix. Large colonic stool burden. Large amount of stool throughout the entire colon. Sigmoid colon is redundant. Mild wall thickening with pericolonic edema in the mid sigmoid colon series 2, image 52. Small volume of stool in the rectum. Vascular/Lymphatic: Aortic atherosclerosis and tortuosity. No aortic aneurysm. No bulky abdominopelvic adenopathy, evaluation limited in the absence of contrast. Reproductive: Uterus and bilateral adnexa are unremarkable. Other: No free air, free fluid, or intra-abdominal fluid collection. Musculoskeletal: T12 compression fracture which is recently assessed on thoracic spine CT 11/18/2019, unchanged no new osseous abnormalities. Mild scoliosis and degenerative change in the lumbar spine. Prior L3-L5 laminectomy on the left. IMPRESSION: 1. Large colonic stool burden throughout the colon consistent with constipation. Focal area of colonic wall thickening in the mid sigmoid with pericolonic edema may  represent focal colitis, including stercoral disease. 2. No hydronephrosis or obstructive uropathy. Urinary bladder is mildly trabeculated with bladder diverticulum on the right. 3. Incidental findings of cholelithiasis, right adrenal adenoma, and small hiatal hernia. Aortic Atherosclerosis (ICD10-I70.0). Electronically Signed   By: Keith Rake M.D.   On: 12/02/2019 06:21    Labs:  CBC: Recent Labs    12/03/19 0528 12/04/19 0613 12/05/19 0505 12/10/19 0714  WBC 9.5 10.9* 9.5 6.4  HGB 13.0 13.7 13.1 13.2  HCT 39.5 41.1 39.9 41.3  PLT 291 286 309 374    COAGS: No results for input(s): INR, APTT in the last 8760 hours.  BMP: Recent Labs    12/02/19 0627 12/03/19 0528 12/04/19 0613 12/05/19 0505  NA 138  137 138 136  K 4.3 4.3 4.1 3.6  CL 105 101 102 99  CO2 22 29 28  33*  GLUCOSE 111* 99 118* 99  BUN 13 16 10 10   CALCIUM 9.3 9.2 9.0 9.2  CREATININE 0.73 0.77 0.68 0.87  GFRNONAA >60 >60 >60 >60  GFRAA >60 >60 >60 >60    LIVER FUNCTION TESTS: Recent Labs    01/21/19 0000  AST 20  ALT 15  ALKPHOS 57  ALBUMIN 3.8    TUMOR MARKERS: No results for input(s): AFPTM, CEA, CA199, CHROMGRNA in the last 8760 hours.  Assessment and Plan:  86 y/o F with symptomatic T12 compression fracture which has failed conservative management who presents today for a T12 KP/VP with Dr. Estanislado Pandy.  Teresa Hahn has been NPO since midnight, Teresa Hahn does not take any blood thinning medications. Afebrile, WBC 6.4, hgb 13.2, plt 374, creatinine 0.85, INR 1.0, UA (-) for UTI.  Risks and benefits of T12 kyphoplasty/vertebroplasty were discussed with the Teresa Hahn including, but not limited to education regarding the natural healing process of compression fractures without intervention, bleeding, infection, cement migration which may cause spinal cord damage, paralysis, pulmonary embolism or even death.  This interventional procedure involves the use of X-rays and because of the nature of the planned  procedure, it is possible that we will have prolonged use of X-ray fluoroscopy. Potential radiation risks to you include (but are not limited to) the following: - A slightly elevated risk for cancer  several years later in life. This risk is typically less than 0.5% percent. This risk is low in comparison to the normal incidence of human cancer, which is 33% for women and 50% for men according to the [place]. - Radiation induced injury can include skin redness, resembling a rash, tissue breakdown / ulcers and hair loss (which can be temporary or permanent).   The likelihood of either of these occurring depends on the difficulty of the procedure and whether you are sensitive to radiation due to previous procedures, disease, or genetic conditions.   IF your procedure requires a prolonged use of radiation, you will be notified and given written instructions for further action.  It is your responsibility to monitor the irradiated area for the 2 weeks following the procedure and to notify your physician if you are concerned that you have suffered a radiation induced injury.    All of the Teresa Hahn's questions were answered, Teresa Hahn is agreeable to proceed.  Consent signed and in chart.  Thank you for this interesting consult.  I greatly enjoyed meeting Teresa Hahn and look forward to participating in their care.  A copy of this report was sent to the requesting provider on this date.  Electronically Signed: Joaquim Nam, PA-C 12/10/2019, 7:44 AM   I spent a total of 30 Minutes  n face to face in clinical consultation, greater than 50% of which was counseling/coordinating care for T12 KP/VP.

## 2019-12-26 ENCOUNTER — Ambulatory Visit: Payer: Medicare PPO | Attending: Internal Medicine

## 2019-12-26 DIAGNOSIS — Z23 Encounter for immunization: Secondary | ICD-10-CM

## 2019-12-26 NOTE — Progress Notes (Signed)
   Covid-19 Vaccination Clinic  Name:  Teresa Hahn    MRN: [id number] DOB: [birth year]  12/26/2019  Teresa Hahn was observed post Covid-19 immunization for 15 minutes without incident. She was provided with Vaccine Information Sheet and instruction to access the V-Safe system.   Teresa Hahn was instructed to call 911 with any severe reactions post vaccine: Marland Kitchen Difficulty breathing  . Swelling of face and throat  . A fast heartbeat  . A bad rash all over body  . Dizziness and weakness   Immunizations Administered    Name Date Dose VIS Date Route   Pfizer COVID-19 Vaccine 12/26/2019  8:58 AM 0.3 mL 09/27/2019 Intramuscular   Manufacturer: [place]   Lot: [id number]   [place]: [id number]

## 2020-01-22 ENCOUNTER — Ambulatory Visit: Payer: Medicare PPO | Attending: Internal Medicine

## 2020-01-22 DIAGNOSIS — Z23 Encounter for immunization: Secondary | ICD-10-CM

## 2020-01-22 NOTE — Progress Notes (Signed)
   Covid-19 Vaccination Clinic  Name:  Teresa Hahn    MRN: [id number] DOB: [birth year]  01/22/2020  [name] was observed post Covid-19 immunization for 15 minutes without incident. She was provided with Vaccine Information Sheet and instruction to access the V-Safe system.   [name] was instructed to call 911 with any severe reactions post vaccine: Marland Kitchen Difficulty breathing  . Swelling of face and throat  . A fast heartbeat  . A bad rash all over body  . Dizziness and weakness   Immunizations Administered    Name Date Dose VIS Date Route   Pfizer COVID-19 Vaccine 01/22/2020  8:45 AM 0.3 mL 09/27/2019 Intramuscular   Manufacturer: [place]   Lot: [id number]   [place]: [id number]

## 2020-03-16 IMAGING — XA IR KYPHO VERTEBRAL THORACIC AUGMENTATION
1 series · 13 of 24 positions shown · non-contrast
Comparison: CT of the thoracic spine November 18, 2019.

INDICATION: Severe low back pain secondary to compression fracture at T12.

EXAM:
BALLOON KYPHOPLASTY AT T12

[Series 300: spine · 13 of 25 slices shown]
[im 1/25]
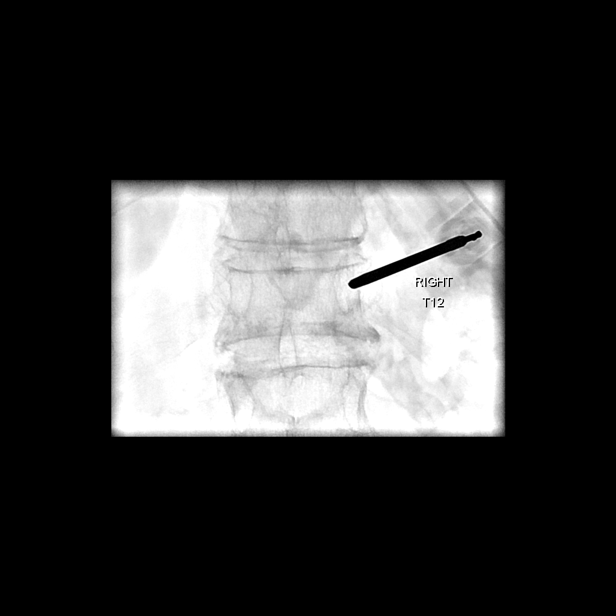
[im 3/25]
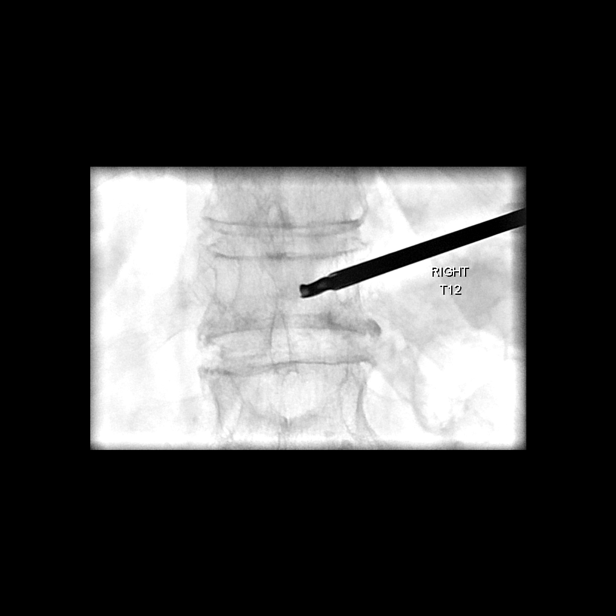
[im 5/25]
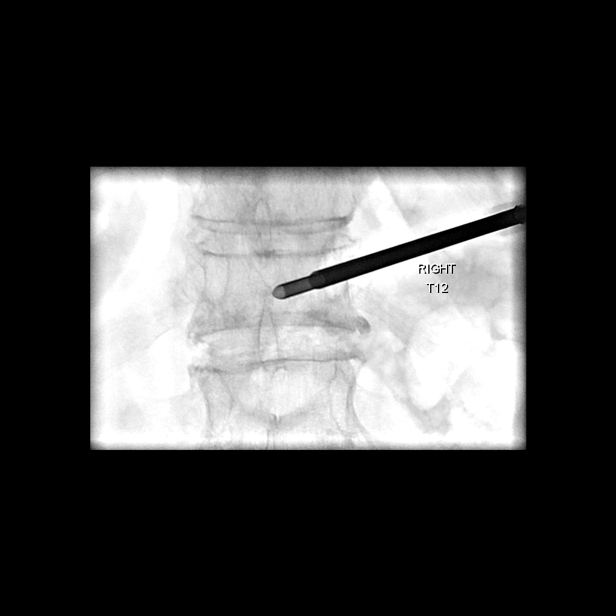
[im 7/25]
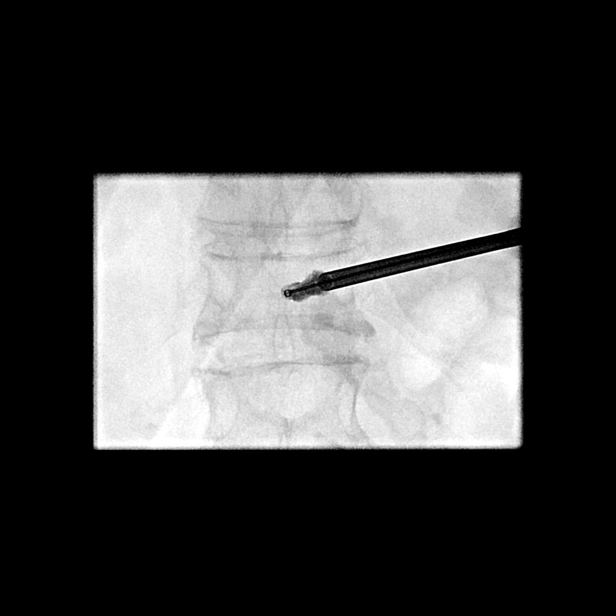
[im 9/25]
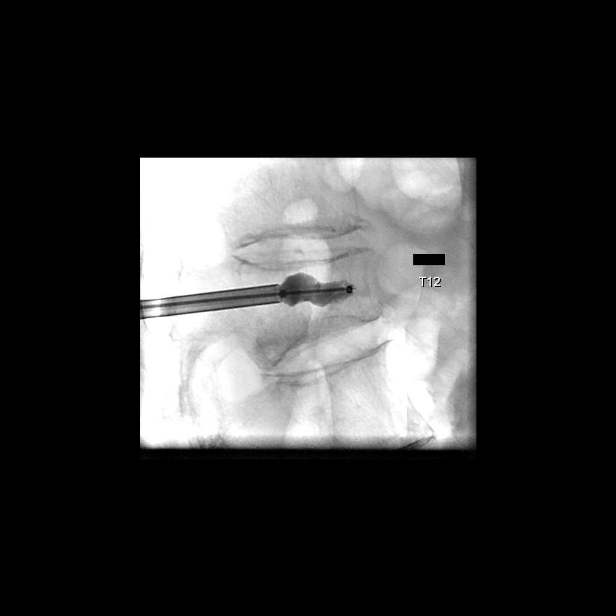
[im 11/25]
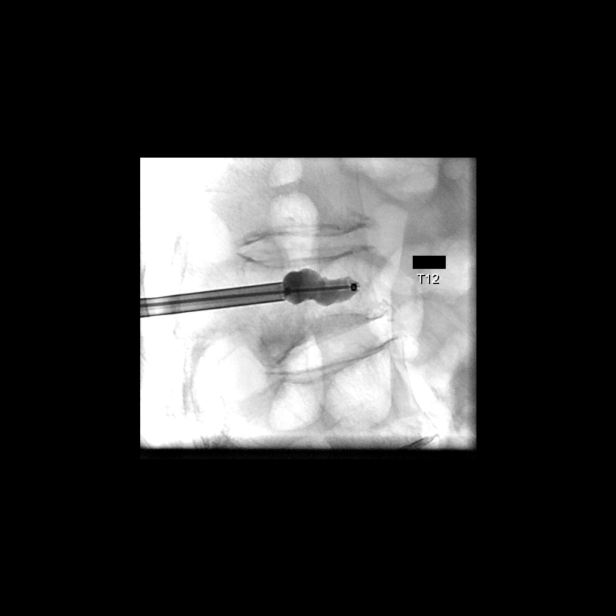
[im 13/25]
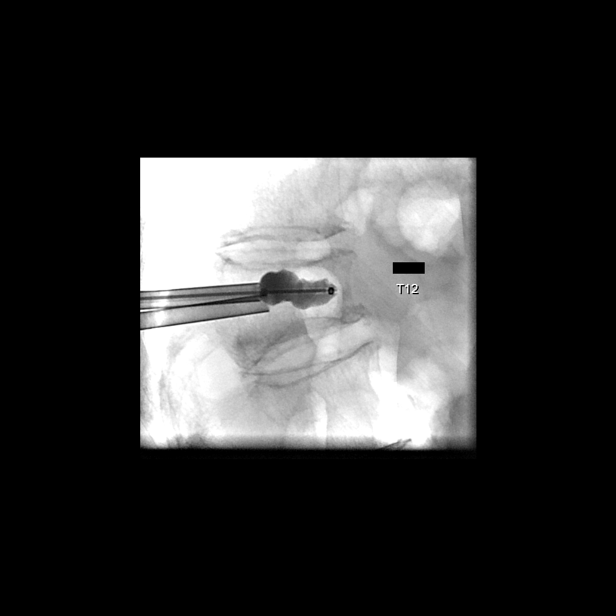
[im 14/25]
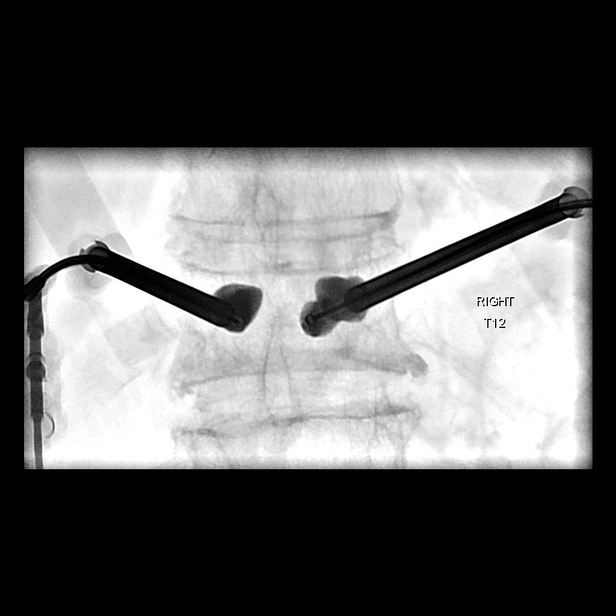
[im 16/25]
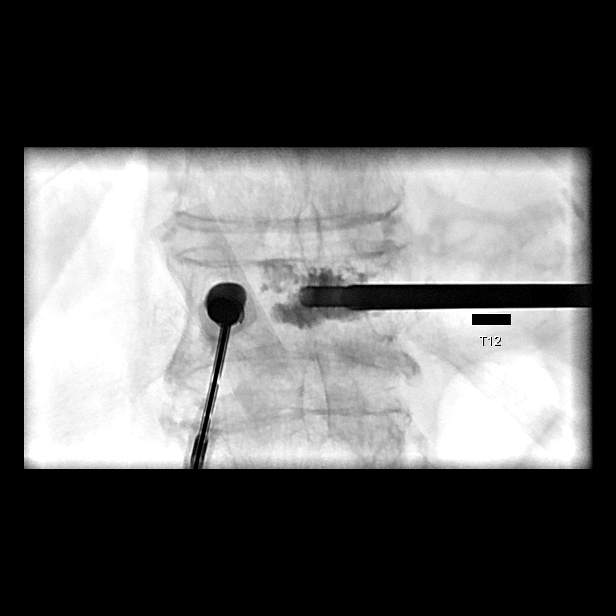
[im 18/25]
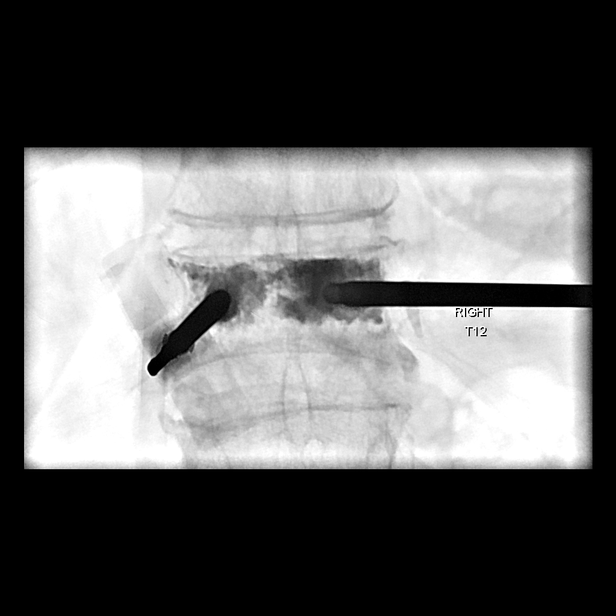
[im 20/25]
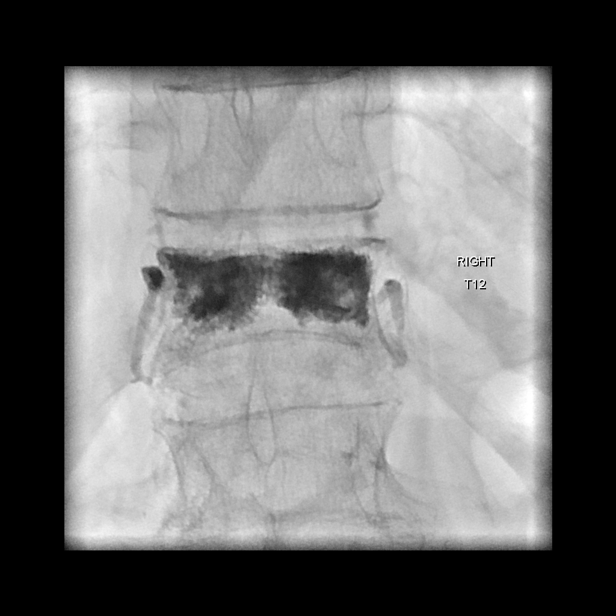
[im 22/25]
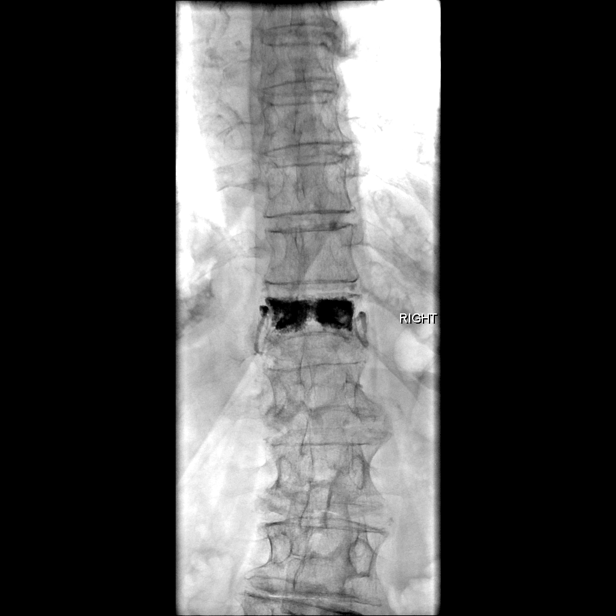
[im 25/25]
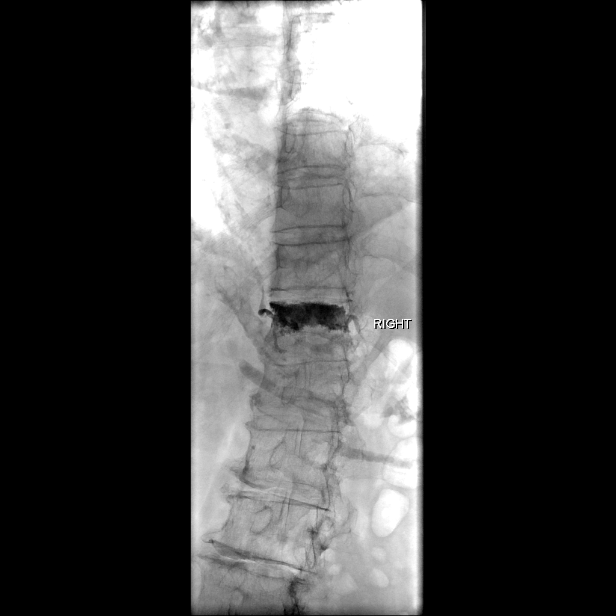

[13 of 24 positions shown; findings below may reference images not displayed]

MEDICATIONS:
As antibiotic prophylaxis, Ancef 2 g IV was ordered pre-procedure
and administered intravenously within 1 hour of incision.

ANESTHESIA/SEDATION:
Moderate (conscious) sedation was employed during this procedure. A
total of Versed 1.5 mg and Fentanyl 37.5 mcg was administered
intravenously.

Moderate Sedation Time: 24 minutes. The patient's level of
consciousness and vital signs were monitored continuously by
radiology nursing throughout the procedure under my direct
supervision.

FLUOROSCOPY TIME:  Fluoroscopy Time: 10 minutes 48 seconds (675.5
mGy)

COMPLICATIONS:
None immediate.

PROCEDURE:
Following a full explanation of the procedure along with the
potential associated complications, an informed witnessed consent
was obtained.

The patient was placed prone on the fluoroscopic table. The skin
overlying the thoracolumbar region was then prepped and draped in
the usual sterile fashion. Both pedicles at T12 were then
infiltrated with 0.25% bupivacaine followed by the advancement of an
11-gauge Jamshidi needle through each pedicle into the posterior
one-third at T12. These were then exchanged for Kyphon advanced
osteo introducer systems comprised of a working cannula and a Kyphon
osteo drill.

The combinations were then advanced over a Kyphon osteo bone pin
until the tips of the Kyphon osteo drills were in the posterior
third at T12.

At this time, the bone pin was removed. In a medial trajectory, the
combination were advanced until the tips of the working cannula were
inside the posterior one-third at T12.

The osteo drills were removed.

Through the working cannulae, a Kyphon inflatable bone tamp 20 x 3
were advanced and positioned with the distal markers 5 mm from the
anterior aspect of T12. Crossing of the midline was seen on the AP
projection. At this time, the balloons were expanded using contrast
via a Kyphon inflation syringe device via microtubing.

Inflations were continued until there was apposition with the
superior endplates.

At this time, methylmethacrylate mixture was reconstituted with
Tobramycin in the Kyphon bone mixing device system. This was then
loaded onto the Kyphon bone fillers.

The balloons were deflated and removed followed by the instillation
of 4 bone filler equivalents of methylmethacrylate mixture at T12
with excellent filling in the AP and lateral projections. No
extravasation was noted in the disk spaces or posteriorly into the
spinal canal. A small amount of the methylmethacrylate mixture was
seen extending into the paraspinous vein on either side. These
remained stable throughout the procedure.

The working cannulae and the bone fillers were then retrieved and
removed. Hemostasis was achieved at the skin entry sites.
IMPRESSION: 1. Status post vertebral body augmentation using balloon kyphoplasty
at T12 as described without event.

## 2020-11-12 ENCOUNTER — Emergency Department: Payer: Medicare PPO

## 2020-11-12 ENCOUNTER — Emergency Department
Admission: EM | Admit: 2020-11-12 | Discharge: 2020-11-12 | Disposition: A | Discharge status: Home / Self Care | Payer: Medicare PPO | Attending: Emergency Medicine | Admitting: Emergency Medicine

## 2020-11-12 ENCOUNTER — Other Ambulatory Visit: Payer: Self-pay

## 2020-11-12 DIAGNOSIS — E039 Hypothyroidism, unspecified: Secondary | ICD-10-CM | POA: Diagnosis not present

## 2020-11-12 DIAGNOSIS — Z79899 Other long term (current) drug therapy: Secondary | ICD-10-CM | POA: Diagnosis not present

## 2020-11-12 DIAGNOSIS — M25561 Pain in right knee: Secondary | ICD-10-CM | POA: Diagnosis not present

## 2020-11-12 DIAGNOSIS — W010XXA Fall on same level from slipping, tripping and stumbling without subsequent striking against object, initial encounter: Secondary | ICD-10-CM | POA: Insufficient documentation

## 2020-11-12 DIAGNOSIS — M25462 Effusion, left knee: Secondary | ICD-10-CM | POA: Insufficient documentation

## 2020-11-12 DIAGNOSIS — M25562 Pain in left knee: Secondary | ICD-10-CM

## 2020-11-12 NOTE — Discharge Instructions (Signed)
Take OTC Tylenol and Motrin for pain. Apply ice packs to reduce swelling. Wear the knee brace when walking with your walker. See Dr. Mack Guise next week for continued symptoms. Return if needed.

## 2020-11-12 NOTE — ED Triage Notes (Signed)
Pt reports falling on R knee. Pt states it was swollen, has been staying in bed except going to bathroom w walker. States can't put any weight on it. States "it's been almost a week so I'd just like to make sure nothing's torn or broken".

## 2020-11-12 NOTE — ED Provider Notes (Signed)
[facility] Emergency Department Provider Note ____________________________________________  Time seen: 2233  I have reviewed the triage vital signs and the nursing notes.  HISTORY  Chief Complaint  Fall and Knee Pain   HPI Teresa Hahn is a 87 y.o. female presents  to the ED, accompanied by her adult daughter.  Patient reports a mechanical fall last week, after she got up out of a chair with fleece socks, that she thought had nonskid soles.  She describes slipping and landing on a flexed right knee.  She describes an audible pop at that time.  Since that time she has had significant swelling and bruising to the right knee.  She describes pain with attempts to bear weight to the right knee.  She has been ambulating only to go to the bathroom, using her late husband's walker.  She denies any other injury related to the fall.  She admits to resting the leg elevated and applying ice intermittently.  Past Medical History:  Diagnosis Date  . Cataract   . Glaucoma   . Graves disease   . Hypothyroidism   . Meningitis    Viral  . Shingles    has had 4 times  . Vitamin B12 deficiency   . Vitamin D deficiency     Patient Active Problem List   Diagnosis Date Noted  . Impacted stool in intestine ([place]) 12/04/2019  . Lower back pain 12/02/2019  . Constipation 12/02/2019  . Compression fracture of T12 vertebra ([place]) 11/04/2019  . Hypothyroidism 10/30/2019  . Vitamin D deficiency, unspecified 03/04/2016  . B12 deficiency 03/04/2016  . H/O lumbosacral spine surgery 03/03/2016  . History of Graves' disease 07/07/2015  . Glaucoma 07/07/2015    Past Surgical History:  Procedure Laterality Date  . CATARACT EXTRACTION, BILATERAL    . IR KYPHO THORACIC WITH BONE BIOPSY  12/10/2019  . SPINAL CORD DECOMPRESSION     L3-5  . THYROID SURGERY      Prior to Admission medications   Medication Sig Start Date End Date Taking? Authorizing Provider  calcium-vitamin D (OSCAL  WITH D) 500-200 MG-UNIT tablet Take 1 tablet by mouth daily.    [provider]  levothyroxine (SYNTHROID) 50 MCG tablet Take 1 tablet (50 mcg total) by mouth daily. 10/31/19   Inda Coke, PA  polyethylene glycol powder (GLYCOLAX/MIRALAX) 17 GM/SCOOP powder Take 17 g by mouth 2 (two) times daily. 12/05/19   Enzo Bi, MD  timolol (BETIMOL) 0.5 % ophthalmic solution Place 1 drop into both eyes 2 (two) times daily. 10/07/19   Inda Coke, PA    Allergies Cyclobenzaprine  Family History  Problem Relation Age of Onset  . Alcohol abuse Mother   . Alcohol abuse Father     Social History Social History   Tobacco Use  . Smoking status: Never Smoker  . Smokeless tobacco: Never Used  Vaping Use  . Vaping Use: Never used  Substance Use Topics  . Alcohol use: Never  . Drug use: Never    Review of Systems  Constitutional: Negative for fever. Cardiovascular: Negative for chest pain. Respiratory: Negative for shortness of breath. Gastrointestinal: Negative for abdominal pain, vomiting and diarrhea. Genitourinary: Negative for dysuria. Musculoskeletal: Negative for back pain.  Right knee pain as above. Skin: Negative for rash. Neurological: Negative for headaches, focal weakness or numbness. ____________________________________________  PHYSICAL EXAM:  VITAL SIGNS: ED Triage Vitals  Enc Vitals Group     BP 11/12/20 2122 (!) 165/99     Pulse Rate  11/12/20 2122 76     Resp 11/12/20 2122 16     Temp 11/12/20 2122 98.4 F (36.9 C)     Temp Source 11/12/20 2122 Oral     SpO2 11/12/20 2122 97 %     Weight 11/12/20 2119 132 lb 0.9 oz (59.9 kg)     Height 11/12/20 2119 5\' 7"  (1.702 m)     Head Circumference --      Peak Flow --      Pain Score 11/12/20 2119 8     Pain Loc --      Pain Edu? --      Excl. in [place]? --     Constitutional: Alert and oriented. Well appearing and in no distress. Head: Normocephalic and atraumatic. Eyes: Conjunctivae are normal. Normal  extraocular movements Cardiovascular: Normal rate, regular rhythm. Normal distal pulses. Respiratory: Normal respiratory effort. No wheezes/rales/rhonchi. Musculoskeletal: Left knee without obvious deformity or dislocation.  There is moderate joint effusion appreciated.  Patient with some mild early ecchymosis noted to the medial inferior aspect of the knee.  She is exquisitely tender palpation to the medial aspect of the knee joint, and is hesitant to fully extend and or flex the knee.  No popliteal space fullness is appreciated.  No calf swelling is noted distally.  Nontender with normal range of motion in all extremities.  Neurologic:  Normal gross sensation. Normal speech and language. No gross focal neurologic deficits are appreciated. Skin:  Skin is warm, dry and intact. No rash noted. Psychiatric: Mood and affect are normal. Patient exhibits appropriate insight and judgment. ____________________________________________   RADIOLOGY  DG Right Knee IMPRESSION: 1. Significant 3 compartmental osteoarthritis, most severe within the lateral compartment. 2. No acute fracture.   CT Right Knee w/o CM IMPRESSION: 1. Diffuse osteopenia.  No acute displaced fracture. 2. Three compartmental osteoarthritis, most severe in the lateral and patellofemoral compartments. 3. Synovial osteochondromatosis. 4. Trace joint effusion, likely reactive. ____________________________________________  PROCEDURES  knee immobilizer  Procedures ____________________________________________  INITIAL IMPRESSION / ASSESSMENT AND PLAN / ED COURSE  DDX: patella fracture, tibial plateau fracture, joint effusion, knee sprain  Geriatric patient with ED evaluation of ongoing right knee pain following a mechanical fall.  She reports pain to the medial aspect of the knee, and pain with attempts to bear weight.  Her exam is overall limited secondary to her pain.  She is tender to palpation to medial joint space in the  medial tibial plateau.  X-ray and CT imaging of the right knee are negative for any acute findings.  Does show severe tricompartmental osteoarthritis.  Patient will be placed in knee immobilizer for weightbearing support.  She will follow-up with Ortho for ongoing symptom management peer return precautions have been discussed.  Patient is inclined to take over-the-counter Tylenol or Motrin as needed for pain.   Teresa Hahn was evaluated in Emergency Department on 11/12/2020 for the symptoms described in the history of present illness. She was evaluated in the context of the global COVID-19 pandemic, which necessitated consideration that the patient might be at risk for infection with the SARS-CoV-2 virus that causes COVID-19. Institutional protocols and algorithms that pertain to the evaluation of patients at risk for COVID-19 are in a state of rapid change based on information released by regulatory bodies including the CDC and federal and state organizations. These policies and algorithms were followed during the patient's care in the ED. ____________________________________________  FINAL CLINICAL IMPRESSION(S) / ED DIAGNOSES  Final diagnoses:  Acute pain  of left knee  Effusion of left knee      Teresa Hahn, Dannielle Karvonen, PA-C 11/12/20 2339    Naaman Plummer, MD 11/13/20 2329

## 2021-02-17 IMAGING — CR DG KNEE COMPLETE 4+V*R*
4 series · 4 of 4 positions shown · non-contrast
Comparison: None.

CLINICAL DATA: Swelling, inability to bear weight

EXAM:
RIGHT KNEE - COMPLETE 4+ VIEW

[knee ap]
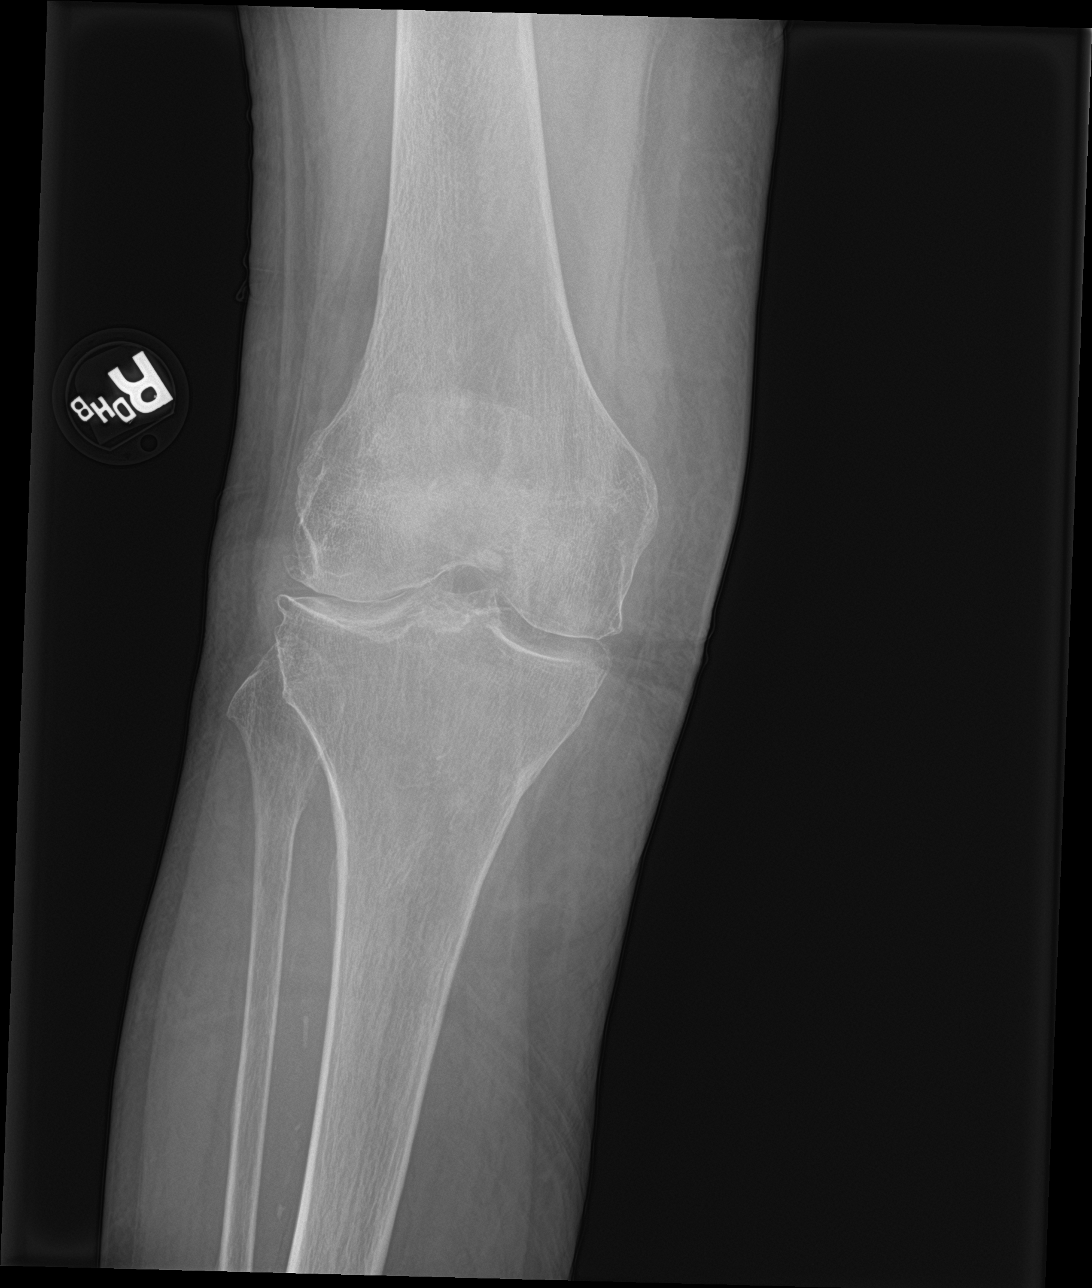

[knee obl (1 of 2)]
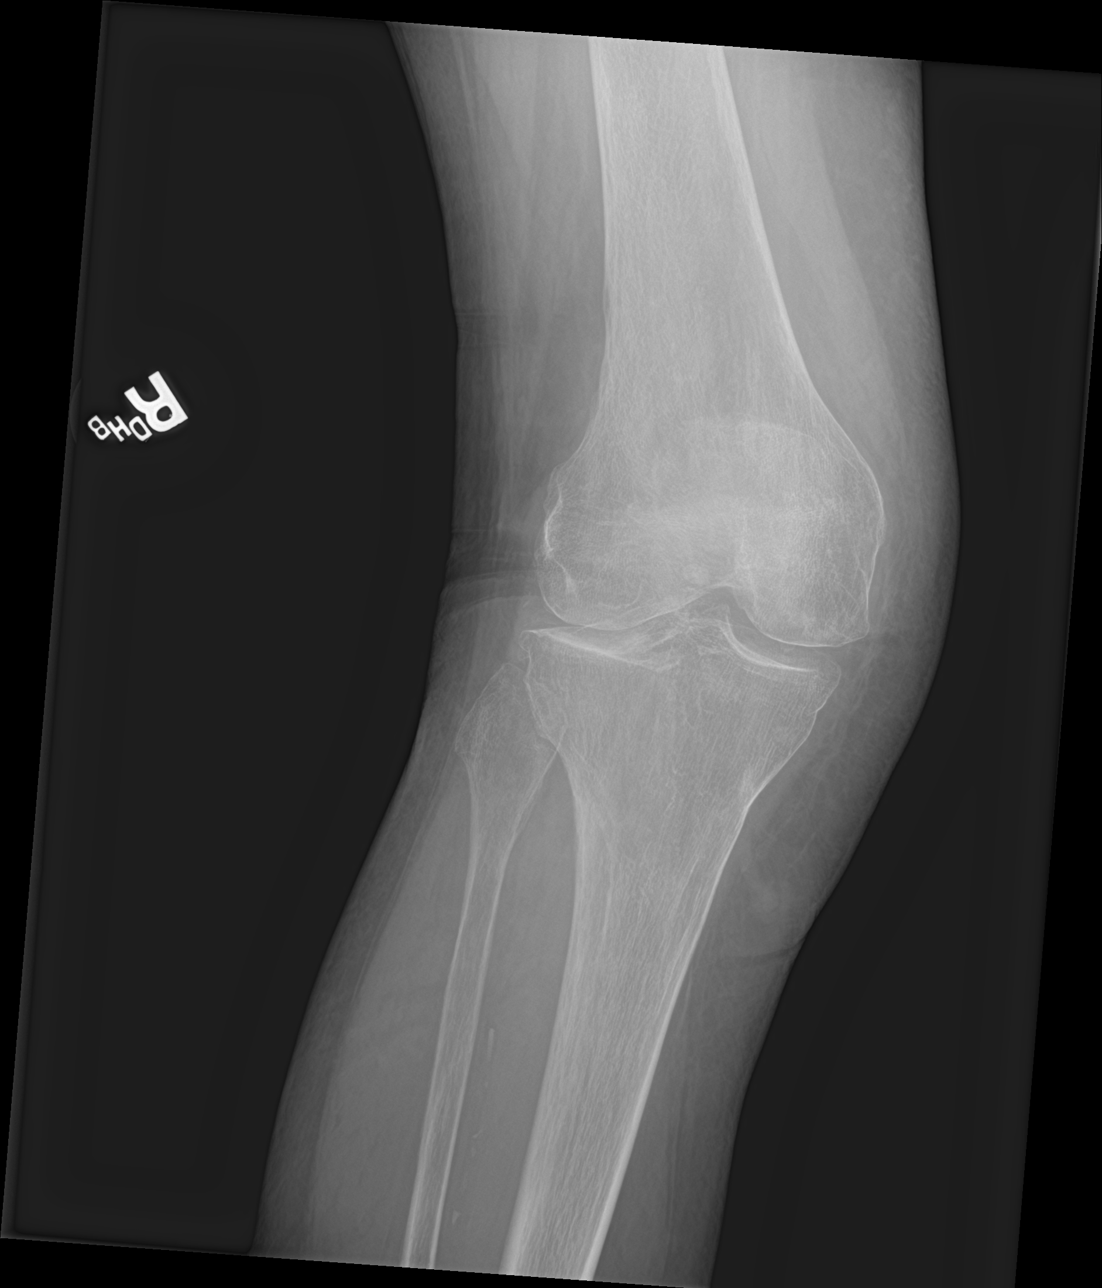

[knee obl (2 of 2)]
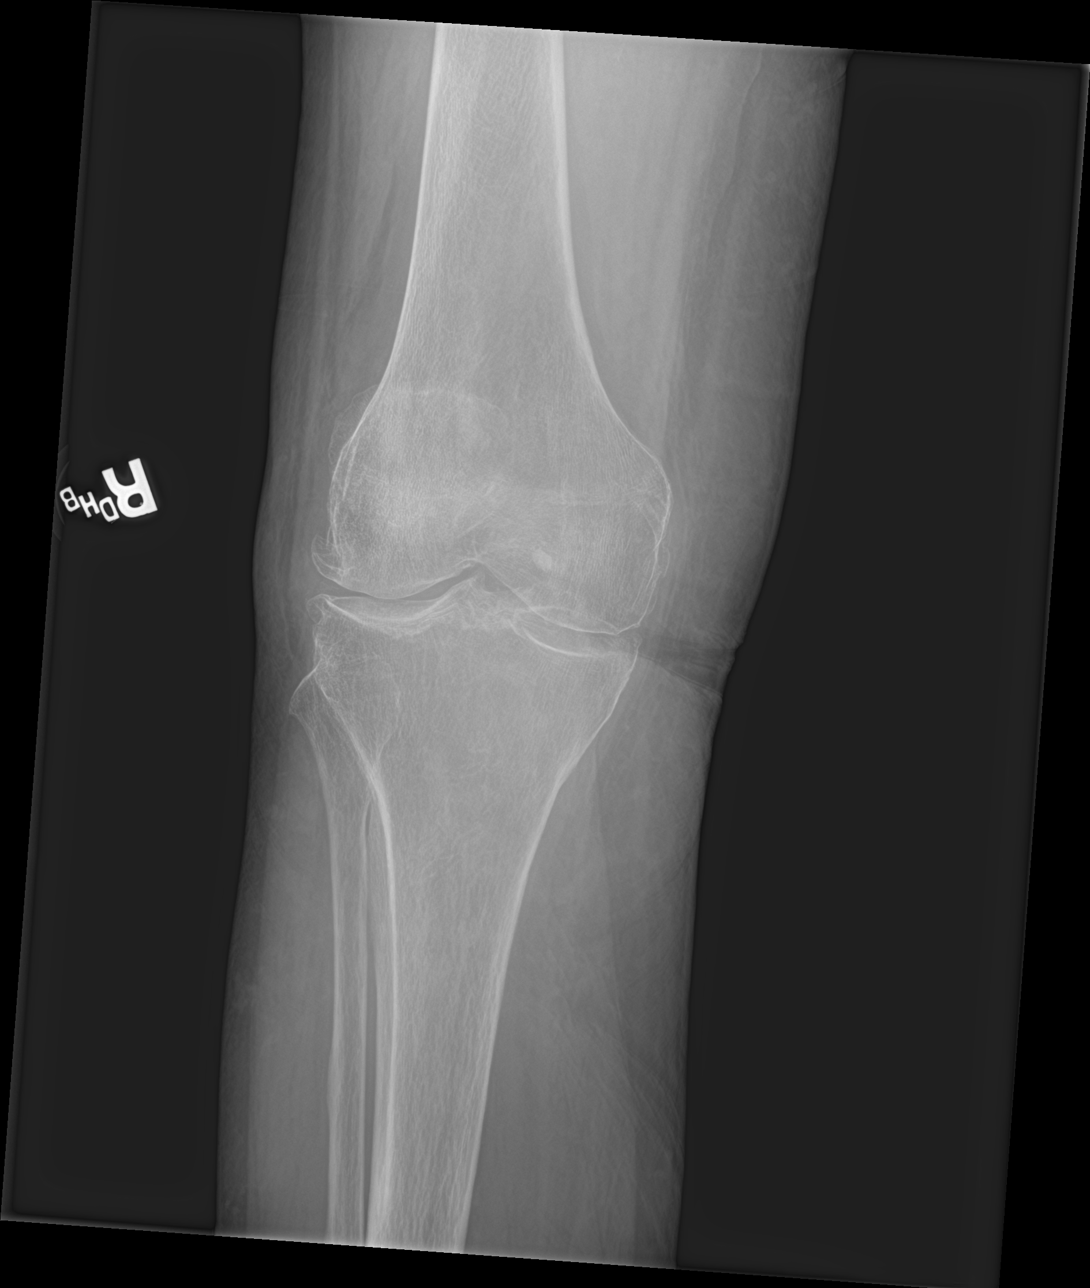

[knee lat]
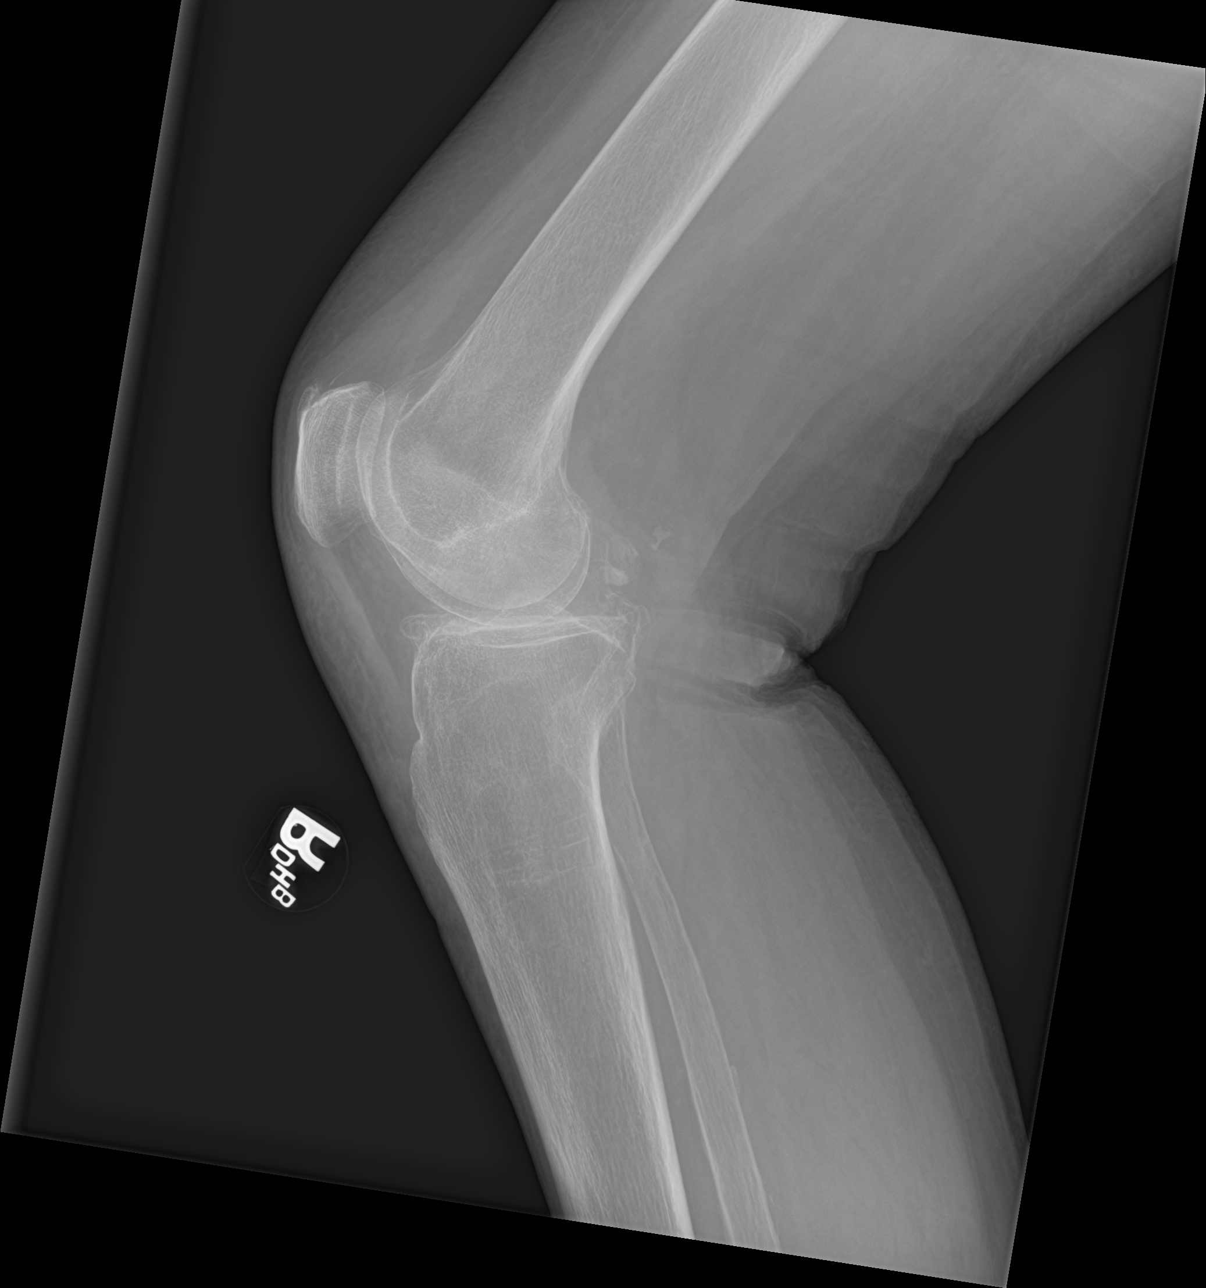

[4 of 4 positions shown; findings below may reference images not displayed]

FINDINGS: Frontal, bilateral oblique, and lateral views of the right knee are
obtained. There is moderate to severe 3 compartmental osteoarthritis
greatest in the lateral compartment. No acute displaced fracture,
subluxation, or dislocation. No joint effusion. Soft tissues are
unremarkable.
IMPRESSION: 1. Significant 3 compartmental osteoarthritis, most severe within
the lateral compartment.
2. No acute fracture.
